# Patient Record
Sex: Male | Born: 1955 | Race: Black or African American | Hispanic: No | Marital: Single | Smoking: Never smoker
Health system: Southern US, Community
[De-identification: ages and names within clinical notes are randomized; demographics above are authoritative.]

## PROBLEM LIST (undated history)

## (undated) DIAGNOSIS — I10 Essential (primary) hypertension: Secondary | ICD-10-CM

## (undated) DIAGNOSIS — E785 Hyperlipidemia, unspecified: Secondary | ICD-10-CM

## (undated) DIAGNOSIS — T7840XA Allergy, unspecified, initial encounter: Secondary | ICD-10-CM

## (undated) DIAGNOSIS — B9562 Methicillin resistant Staphylococcus aureus infection as the cause of diseases classified elsewhere: Secondary | ICD-10-CM

## (undated) DIAGNOSIS — L0390 Cellulitis, unspecified: Secondary | ICD-10-CM

## (undated) DIAGNOSIS — E559 Vitamin D deficiency, unspecified: Secondary | ICD-10-CM

## (undated) HISTORY — DX: Essential (primary) hypertension: I10

## (undated) HISTORY — DX: Allergy, unspecified, initial encounter: T78.40XA

## (undated) HISTORY — DX: Vitamin D deficiency, unspecified: E55.9

## (undated) HISTORY — DX: Hyperlipidemia, unspecified: E78.5

---

## 1998-07-30 ENCOUNTER — Encounter: Payer: Self-pay | Admitting: Emergency Medicine

## 1998-07-30 ENCOUNTER — Emergency Department (HOSPITAL_COMMUNITY): Admission: EM | Admit: 1998-07-30 | Discharge: 1998-07-30 | Payer: Self-pay | Admitting: Emergency Medicine

## 1998-08-01 ENCOUNTER — Encounter: Payer: Self-pay | Admitting: Emergency Medicine

## 1998-08-01 ENCOUNTER — Emergency Department (HOSPITAL_COMMUNITY): Admission: EM | Admit: 1998-08-01 | Discharge: 1998-08-01 | Payer: Self-pay | Admitting: Emergency Medicine

## 2002-07-04 ENCOUNTER — Emergency Department (HOSPITAL_COMMUNITY): Admission: EM | Admit: 2002-07-04 | Discharge: 2002-07-04 | Payer: Self-pay | Admitting: *Deleted

## 2002-07-04 ENCOUNTER — Encounter: Payer: Self-pay | Admitting: *Deleted

## 2004-10-12 ENCOUNTER — Emergency Department (HOSPITAL_COMMUNITY): Admission: EM | Admit: 2004-10-12 | Discharge: 2004-10-12 | Payer: Self-pay | Admitting: Emergency Medicine

## 2005-04-18 ENCOUNTER — Emergency Department (HOSPITAL_COMMUNITY): Admission: EM | Admit: 2005-04-18 | Discharge: 2005-04-18 | Payer: Self-pay | Admitting: Family Medicine

## 2005-05-02 ENCOUNTER — Emergency Department (HOSPITAL_COMMUNITY): Admission: EM | Admit: 2005-05-02 | Discharge: 2005-05-02 | Payer: Self-pay | Admitting: Emergency Medicine

## 2009-11-02 ENCOUNTER — Emergency Department (HOSPITAL_COMMUNITY): Admission: EM | Admit: 2009-11-02 | Discharge: 2009-11-02 | Payer: Self-pay | Admitting: Emergency Medicine

## 2009-11-06 ENCOUNTER — Emergency Department (HOSPITAL_COMMUNITY): Admission: EM | Admit: 2009-11-06 | Discharge: 2009-11-06 | Payer: Self-pay | Admitting: Emergency Medicine

## 2010-12-19 ENCOUNTER — Ambulatory Visit (AMBULATORY_SURGERY_CENTER): Payer: PRIVATE HEALTH INSURANCE | Admitting: *Deleted

## 2010-12-19 VITALS — Ht 64.0 in | Wt 175.0 lb

## 2010-12-19 DIAGNOSIS — Z1211 Encounter for screening for malignant neoplasm of colon: Secondary | ICD-10-CM

## 2010-12-19 MED ORDER — PEG-KCL-NACL-NASULF-NA ASC-C 100 G PO SOLR: ORAL | Status: DC

## 2011-01-01 ENCOUNTER — Encounter: Payer: Self-pay | Admitting: Internal Medicine

## 2011-01-01 DIAGNOSIS — M839 Adult osteomalacia, unspecified: Secondary | ICD-10-CM | POA: Insufficient documentation

## 2011-01-02 ENCOUNTER — Encounter: Payer: Self-pay | Admitting: Internal Medicine

## 2011-01-02 ENCOUNTER — Ambulatory Visit (AMBULATORY_SURGERY_CENTER): Payer: PRIVATE HEALTH INSURANCE | Admitting: Internal Medicine

## 2011-01-02 VITALS — BP 128/75 | HR 101 | Temp 97.6°F | Resp 22 | Ht 64.0 in | Wt 175.0 lb

## 2011-01-02 DIAGNOSIS — Z1211 Encounter for screening for malignant neoplasm of colon: Secondary | ICD-10-CM

## 2011-01-02 DIAGNOSIS — D126 Benign neoplasm of colon, unspecified: Secondary | ICD-10-CM

## 2011-01-02 MED ORDER — SODIUM CHLORIDE 0.9 % IV SOLN: 500.0000 mL | INTRAVENOUS | Status: DC

## 2011-01-02 NOTE — Patient Instructions (Signed)
Polyps, Colon  A polyp is extra tissue that grows inside your body. Colon polyps grow in the large intestine. The large intestine, also called the colon, is part of your digestive system. It is a long, hollow tube at the end of your digestive tract where your body makes and stores stool. Most polyps are not dangerous. They are benign. This means they are not cancerous. But over time, some types of polyps can turn into cancer. Polyps that are smaller than a pea are usually not harmful. But larger polyps could someday become or may already be cancerous. To be safe, doctors remove all polyps and test them.  WHO GETS POLYPS? Anyone can get polyps, but certain people are more likely than others. You may have a greater chance of getting polyps if:  You are over 50.   You have had polyps before.   Someone in your family has had polyps.   Someone in your family has had cancer of the large intestine.   Find out if someone in your family has had polyps. You may also be more likely to get polyps if you:   Eat a lot of fatty foods   Smoke   Drink alcohol   Do not exercise  Eat too much  SYMPTOMS Most small polyps do not cause symptoms. People often do not know they have one until their caregiver finds it during a regular checkup or while testing them for something else. Some people do have symptoms like these:  Bleeding from the anus. You might notice blood on your underwear or on toilet paper after you have had a bowel movement.   Constipation or diarrhea that lasts more than a week.   Blood in the stool. Blood can make stool look black or it can show up as red streaks in the stool.  If you have any of these symptoms, see your caregiver. HOW DOES THE DOCTOR TEST FOR POLYPS? The doctor can use four tests to check for polyps:  Digital rectal exam. The caregiver wears gloves and checks your rectum (the last part of the large intestine) to see if it feels normal. This test would find polyps only  in the rectum. Your caregiver may need to do one of the other tests listed below to find polyps higher up in the intestine.   Barium enema. The caregiver puts a liquid called barium into your rectum before taking x-rays of your large intestine. Barium makes your intestine look white in the pictures. Polyps are dark, so they are easy to see.   Sigmoidoscopy. With this test, the caregiver can see inside your large intestine. A thin flexible tube is placed into your rectum. The device is called a sigmoidoscope, which has a light and a tiny video camera in it. The caregiver uses the sigmoidoscope to look at the last third of your large intestine.   Colonoscopy. This test is like sigmoidoscopy, but the caregiver looks at all of the large intestine. It usually requires sedation. This is the most common method for finding and removing polyps.  TREATMENT  The caregiver will remove the polyp during sigmoidoscopy or colonoscopy. The polyp is then tested for cancer.   If you have had polyps, your caregiver may want you to get tested regularly in the future.  PREVENTION There is not one sure way to prevent polyps. You might be able to lower your risk of getting them if you:  Eat more fruits and vegetables and less fatty food.     Do not smoke.   Avoid alcohol.   Exercise every day.   Lose weight if you are overweight.   Eating more calcium and folate can also lower your risk of getting polyps. Some foods that are rich in calcium are milk, cheese, and broccoli. Some foods that are rich in folate are chickpeas, kidney beans, and spinach.   Aspirin might help prevent polyps. Studies are under way.  Document Released: 05/23/2004 Document Re-Released: 02/14/2010 Exit[employer] Patient Information 2011 [employer]. 

## 2011-01-03 ENCOUNTER — Telehealth: Payer: Self-pay | Admitting: *Deleted

## 2011-01-03 ENCOUNTER — Telehealth: Payer: Self-pay | Admitting: Internal Medicine

## 2011-01-03 ENCOUNTER — Telehealth: Payer: Self-pay

## 2011-01-03 ENCOUNTER — Encounter: Payer: Self-pay | Admitting: *Deleted

## 2011-01-03 NOTE — Telephone Encounter (Signed)
Patient hung up telephone.

## 2011-01-03 NOTE — Telephone Encounter (Signed)
Spoke with patient concerning questions. Answered all questions.

## 2011-01-03 NOTE — Telephone Encounter (Signed)
Return to work note given to pt. 

## 2011-01-10 ENCOUNTER — Encounter: Payer: Self-pay | Admitting: Internal Medicine

## 2011-01-10 NOTE — Progress Notes (Signed)
Quick Note:  3 adenomas (at least) one polyp lost Repeat colonoscopy approx 12/2013 ______

## 2011-10-12 ENCOUNTER — Telehealth: Payer: Self-pay | Admitting: Internal Medicine

## 2011-10-12 NOTE — Telephone Encounter (Signed)
Patient reports that he has intermittent blood on the tissue after a BM.  Happens about once a month.  He is asking what he needs to do.  He did have a colon in April with 5 polyps removed.  He is asked to try sitz bath, try OTC hydrocortisone cream to rectal area, high fiber diet, and increase the amount of fluid in his diet. He is asked to call back if no improvement or worsening of his rectal bleeding or seek care at an Urgent Care or ER if it is not during regular office hours.  The patient verbalized understanding.

## 2011-10-15 NOTE — Telephone Encounter (Signed)
Agree with recommendations.  

## 2013-01-02 ENCOUNTER — Telehealth: Payer: Self-pay | Admitting: Physician Assistant

## 2013-01-02 NOTE — Telephone Encounter (Signed)
Was concerned because Viagra was not working "at all"  He has been unable to get an erection.  Also stated his BP has been running really good and he has not had to use his BP meds.  Pt told really was due for routine office visit to have lab work and check BP here.  Can discuss his other issue at same time.  States just signed up for new insurance and when he receives his card will call and schedule appt.

## 2013-04-27 ENCOUNTER — Telehealth: Payer: Self-pay | Admitting: Physician Assistant

## 2013-04-27 ENCOUNTER — Encounter: Payer: Self-pay | Admitting: Physician Assistant

## 2013-04-27 ENCOUNTER — Ambulatory Visit (INDEPENDENT_AMBULATORY_CARE_PROVIDER_SITE_OTHER): Payer: BC Managed Care – PPO | Admitting: Physician Assistant

## 2013-04-27 VITALS — BP 152/104 | HR 68 | Temp 97.6°F | Resp 18 | Ht 63.5 in | Wt 167.0 lb

## 2013-04-27 DIAGNOSIS — E559 Vitamin D deficiency, unspecified: Secondary | ICD-10-CM | POA: Insufficient documentation

## 2013-04-27 DIAGNOSIS — I10 Essential (primary) hypertension: Secondary | ICD-10-CM | POA: Insufficient documentation

## 2013-04-27 DIAGNOSIS — Z125 Encounter for screening for malignant neoplasm of prostate: Secondary | ICD-10-CM

## 2013-04-27 DIAGNOSIS — E785 Hyperlipidemia, unspecified: Secondary | ICD-10-CM | POA: Insufficient documentation

## 2013-04-27 MED ORDER — BENAZEPRIL-HYDROCHLOROTHIAZIDE 10-12.5 MG PO TABS: 1.0000 | ORAL_TABLET | Freq: Every day | ORAL | Status: DC

## 2013-04-27 NOTE — Progress Notes (Signed)
Patient ID: Robert Novak MRN: [id number], DOB: April 13, 1956, 57 y.o. Date of Encounter: @DATE @  Chief Complaint:  Chief Complaint  Patient presents with  . wife states he has yeast infection  . Medication Refill    BP med  not fasting  has benn out of med    HPI: 57 y.o. year old AA male  presents for routine followup office visit. Actually his last regular office visit was back in April 2012. He says the reason he did not have followup since then with because of insurance changes. He says that back then he was taking his blood pressure and cholesterol medicines as directed with no adverse effects. The medicine simply were stopped because of lack of insurance at one point. He is agreeable to restart these medications if needed. He does have insurance coverage at this time.  He reports that his wife has been having some vaginal irritation. She is notices after having sex and therefore thought that maybe it was coming from him. However he says he has had no type of discharge and no rash or abnormality. No dysuria or pyuria.  He has had no other medical problems arises since his last office visit here.   Past Medical History  Diagnosis Date  . Allergy     pollen  . Hypertension   . Hyperlipidemia   . Vitamin D deficiency      Home Meds: See attached medication section for current medication list. Any medications entered into computer today will not appear on this note's list. The medications listed below were entered prior to today. Current Outpatient Prescriptions on File Prior to Visit  Medication Sig Dispense Refill  . ergocalciferol (VITAMIN D2) 50000 UNITS capsule Take 50,000 Units by mouth once a week.         No current facility-administered medications on file prior to visit.    Allergies: No Known Allergies  History   Social History  . Marital Status: Single    Spouse Name: N/A    Number of Children: N/A  . Years of Education: N/A   Occupational History  . Not  on file.   Social History Main Topics  . Smoking status: Never Smoker   . Smokeless tobacco: Never Used  . Alcohol Use: No  . Drug Use: No  . Sexual Activity: Not on file   Other Topics Concern  . Not on file   Social History Narrative  . No narrative on file    No family history on file.   Review of Systems:  See HPI for pertinent ROS. All other ROS negative.     Physical Exam: Blood pressure 152/104, pulse 68, temperature 97.6 F (36.4 C), temperature source Oral, resp. rate 18, height 5' 3.5" (1.613 m), weight 167 lb (75.751 kg)., Body mass index is 29.12 kg/(m^2). General: WNWD AAM. Appears in no acute distress. Neck: Supple. No thyromegaly. No lymphadenopathy.no carotid bruit Lungs: Clear bilaterally to auscultation without wheezes, rales, or rhonchi. Breathing is unlabored. Heart: RRR with S1 S2. No murmurs, rubs, or gallops. Abdomen: Soft, non-tender, non-distended with normoactive bowel sounds. No hepatomegaly. No rebound/guarding. No obvious abdominal masses. Musculoskeletal:  Strength and tone normal for age. Extremities/Skin: Warm and dry. No edema.  Neuro: Alert and oriented X 3. Moves all extremities spontaneously. Gait is normal. CNII-XII grossly in tact. Psych:  Responds to questions appropriately with a normal affect.     ASSESSMENT AND PLAN: he is not fasting today. However he is agreeable to return 57  y.o. year old male with  1. Hyperlipidemia He is nonfasting today. However he is agreeable to return to the clinic fasting to do lab work. Noted that he has lost 13 pounds since his office visit here in April 2012. It is possible that his lipid profile will be different. Therefore think we should repeat a baseline prior to restarting any statin therapy. He says he has been exercising on a treadmill has been the main change. - COMPLETE METABOLIC PANEL WITH GFR; Future - Lipid panel; Future  2. Hypertension He needs to restart his blood pressure medicine. He  is agreeable to do so. He will go ahead and start this and return to clinic to check labs in one week. - benazepril-hydrochlorthiazide (LOTENSIN HCT) 10-12.5 MG per tablet; Take 1 tablet by mouth daily.  Dispense: 90 tablet; Refill: 3 - COMPLETE METABOLIC PANEL WITH GFR; Future  3. Vitamin D deficiency History of vitamin D deficiency in the past. We'll check this level at the Appearing - Vitamin D 25 hydroxy; Future  4. Screening for prostate cancer Last PSA was 312. We'll repeat this with the upcoming lab. - PSA; Future  #5 screening colonoscopy He did have this performed 4 2012. He had a polyp. He is to repeat this in 3 years. This is with [facility].  Signed, [address] [place], [place], [facility] 04/27/2013 10:09 AM

## 2013-04-28 MED ORDER — HYDROCHLOROTHIAZIDE 12.5 MG PO CAPS: 12.5000 mg | ORAL_CAPSULE | Freq: Every day | ORAL | Status: DC

## 2013-04-28 MED ORDER — BENAZEPRIL HCL 10 MG PO TABS: 10.0000 mg | ORAL_TABLET | Freq: Every day | ORAL | Status: DC

## 2013-04-28 NOTE — Telephone Encounter (Signed)
Combo pill deleted and new meds ordered.  Pt is aware

## 2013-04-28 NOTE — Telephone Encounter (Signed)
At OV I restarted BP med he had taken in past.  I guess Insurance just does not cover Combo Pill. Will separate meds. They should be very cheap.  Benazepril 10mg  one po QD # 90 /3 HCTZ 12.5 mg one po QD # 90. 3

## 2013-12-28 ENCOUNTER — Encounter: Payer: Self-pay | Admitting: Family Medicine

## 2013-12-28 ENCOUNTER — Ambulatory Visit (INDEPENDENT_AMBULATORY_CARE_PROVIDER_SITE_OTHER): Payer: Self-pay | Admitting: Family Medicine

## 2013-12-28 VITALS — BP 134/68 | HR 84 | Temp 98.1°F | Resp 16 | Ht 64.0 in | Wt 166.0 lb

## 2013-12-28 DIAGNOSIS — M7030 Other bursitis of elbow, unspecified elbow: Secondary | ICD-10-CM | POA: Insufficient documentation

## 2013-12-28 DIAGNOSIS — L03113 Cellulitis of right upper limb: Secondary | ICD-10-CM

## 2013-12-28 DIAGNOSIS — M7020 Olecranon bursitis, unspecified elbow: Secondary | ICD-10-CM

## 2013-12-28 DIAGNOSIS — IMO0002 Reserved for concepts with insufficient information to code with codable children: Secondary | ICD-10-CM

## 2013-12-28 MED ORDER — SULFAMETHOXAZOLE-TMP DS 800-160 MG PO TABS: 1.0000 | ORAL_TABLET | Freq: Two times a day (BID) | ORAL | Status: DC

## 2013-12-28 NOTE — Patient Instructions (Signed)
Continue blood pressure pills Take ibuprofen or aleve twice a day Start antibiotics twice a day Note for work Return on Wed for a recheck  If you get fever, or elbow joint pain go to the ER

## 2013-12-28 NOTE — Progress Notes (Signed)
Patient ID: Robert Novak, male   DOB: 12/17/1955, 58 y.o.   MRN: [id number]   Subjective:    Patient ID: Robert Novak, male    DOB: October 07, 1955, 58 y.o.   MRN: [id number]  Patient presents for Cellulitis  Pt here with right elbow swelling and redness. He states on Friday there was a wasp in his car but he does not remember being stung subsequently that evening he began having some redness and swelling of the elbow. He was able to work that evening. Over the weekend he noticed that his arm was still red and swollen but the erythema did not spread. He has some mild soreness over the swelling over his elbow but no pain when he moves his arm or the elbow joint. He has not had any fever but states that he had some chills a couple of days. He denies any chest pain shortness of breath. He does not remember any other injury. He does note that he's had a mole on his elbow for many years which is unchanged.    GEN- denies fatigue, fever, weight loss,weakness, recent illness HEENT- denies eye drainage, change in vision, nasal discharge, CVS- denies chest pain, palpitations RESP- denies SOB, cough, wheeze ABD- denies N/V, change in stools, abd pain MSK- denies joint pain, muscle aches, injury Neuro- denies headache, dizziness, syncope, seizure activity       Objective:    BP 134/68  Pulse 84  Temp(Src) 98.1 F (36.7 C) (Oral)  Resp 16  Ht 5\' 4"  (1.626 m)  Wt 166 lb (75.297 kg)  BMI 28.48 kg/m2 GEN- NAD, alert and oriented x3, afebrile MSK- Right arm- FROM, Elbow, non tender with extension and flexion, swelling of elbow- NT  Skin- erythema extending 2-3 inches around elbow,+ warmth, , no induration, +mole on tip of elbow EXT- swelling of area of erythema  Pulses- Radial 2+        Assessment & Plan:      Problem List Items Addressed This Visit   Cellulitis of arm, right - Primary   Bursitis of elbow      Note: This dictation was prepared with Dragon dictation along with smaller  phrase technology. Any transcriptional errors that result from this process are unintentional.

## 2013-12-28 NOTE — Assessment & Plan Note (Addendum)
Start him on Bactrim double strength one tablet twice a day he will return in 48 hours Does not appear to be causing any pain with manipulation and the joint.

## 2013-12-28 NOTE — Assessment & Plan Note (Signed)
This appears to be more bursitis he has no pain with joint movement or manipulation. I'll have him use ice and ibuprofen, no sign of joint infection

## 2013-12-30 ENCOUNTER — Telehealth: Payer: Self-pay | Admitting: *Deleted

## 2013-12-30 ENCOUNTER — Ambulatory Visit (INDEPENDENT_AMBULATORY_CARE_PROVIDER_SITE_OTHER): Payer: Self-pay | Admitting: Family Medicine

## 2013-12-30 ENCOUNTER — Encounter: Payer: Self-pay | Admitting: Family Medicine

## 2013-12-30 ENCOUNTER — Encounter (HOSPITAL_COMMUNITY): Payer: Self-pay | Admitting: Emergency Medicine

## 2013-12-30 ENCOUNTER — Emergency Department (HOSPITAL_COMMUNITY)
Admission: EM | Admit: 2013-12-30 | Discharge: 2013-12-30 | Disposition: A | Discharge status: Home / Self Care | Payer: PRIVATE HEALTH INSURANCE | Attending: Emergency Medicine | Admitting: Emergency Medicine

## 2013-12-30 VITALS — BP 136/82 | HR 78 | Temp 97.9°F | Resp 16 | Ht 64.0 in | Wt 162.0 lb

## 2013-12-30 DIAGNOSIS — M719 Bursopathy, unspecified: Secondary | ICD-10-CM

## 2013-12-30 DIAGNOSIS — Z792 Long term (current) use of antibiotics: Secondary | ICD-10-CM | POA: Insufficient documentation

## 2013-12-30 DIAGNOSIS — Z79899 Other long term (current) drug therapy: Secondary | ICD-10-CM | POA: Insufficient documentation

## 2013-12-30 DIAGNOSIS — E785 Hyperlipidemia, unspecified: Secondary | ICD-10-CM | POA: Insufficient documentation

## 2013-12-30 DIAGNOSIS — E559 Vitamin D deficiency, unspecified: Secondary | ICD-10-CM | POA: Insufficient documentation

## 2013-12-30 DIAGNOSIS — M7020 Olecranon bursitis, unspecified elbow: Secondary | ICD-10-CM | POA: Insufficient documentation

## 2013-12-30 DIAGNOSIS — IMO0002 Reserved for concepts with insufficient information to code with codable children: Secondary | ICD-10-CM

## 2013-12-30 DIAGNOSIS — M7021 Olecranon bursitis, right elbow: Secondary | ICD-10-CM

## 2013-12-30 DIAGNOSIS — L03113 Cellulitis of right upper limb: Secondary | ICD-10-CM

## 2013-12-30 DIAGNOSIS — I10 Essential (primary) hypertension: Secondary | ICD-10-CM | POA: Insufficient documentation

## 2013-12-30 DIAGNOSIS — M7030 Other bursitis of elbow, unspecified elbow: Secondary | ICD-10-CM

## 2013-12-30 NOTE — Discharge Instructions (Signed)
Olecranon Bursitis Bursitis is swelling and soreness (inflammation) of a fluid-filled sac (bursa) that covers and protects a joint. Olecranon bursitis occurs over the elbow.  CAUSES Bursitis can be caused by injury, overuse of the joint, arthritis, or infection.  SYMPTOMS   Tenderness, swelling, warmth, or redness over the elbow.  Elbow pain with movement. This is greater with bending the elbow.  Squeaking sound when the bursa is rubbed or moved.  Increasing size of the bursa without pain or discomfort.  Fever with increasing pain and swelling if the bursa becomes infected. HOME CARE INSTRUCTIONS   Put ice on the affected area.  Put ice in a plastic bag.  Place a towel between your skin and the bag.  Leave the ice on for 15-20 minutes each hour while awake. Do this for the first 2 days.  When resting, elevate your elbow above the level of your heart. This helps reduce swelling.  Continue to put the joint through a full range of motion 4 times per day. Rest the injured joint at other times. When the pain lessens, begin normal slow movements and usual activities.  Only take over-the-counter or prescription medicines for pain, discomfort, or fever as directed by your caregiver.  Reduce your intake of milk and related dairy products (cheese, yogurt). They may make your condition worse. SEEK IMMEDIATE MEDICAL CARE IF:   Your pain increases even during treatment.  You have a fever.  You have heat and inflammation over the bursa and elbow.  You have a red line that goes up your arm.  You have pain with movement of your elbow. MAKE SURE YOU:   Understand these instructions.  Will watch your condition.  Will get help right away if you are not doing well or get worse. Document Released: 09/26/2006 Document Revised: 11/19/2011 Document Reviewed: 08/12/2007 ExitCare Patient Information 2014 [employer].  

## 2013-12-30 NOTE — Telephone Encounter (Signed)
Patient noted to have come in to office to have MD assess increased edema.   Stat referral to be placed.   MD assessed.

## 2013-12-30 NOTE — Telephone Encounter (Signed)
Patient requested that Marc Morgans, shift supervisor be made aware.   [phone].   Call placed and Doug made aware.

## 2013-12-30 NOTE — ED Notes (Signed)
Bed: WTR6 Expected date:  Expected time:  Means of arrival:  Comments: 

## 2013-12-30 NOTE — ED Provider Notes (Signed)
CSN: [id number]     Arrival date & time 12/30/13  2042 History  This chart was scribed for non-physician practitioner Antonietta Breach, PA-C working with Virgel Manifold, MD by Eston Mould, ED Scribe. This patient was seen in room WTR6/WTR6 and the patient's care was started at 10:25 PM .    Chief Complaint  Patient presents with  . Abscess   The history is provided by the patient. No language interpreter was used.   HPI Comments: Robert Novak is a 58 y.o. male who presents to the Emergency Department complaining of abscess to R elbow. He states the abscess presented 5 days ago. Pt states he had a wasp in his car, swapped the wasp and got out his car. He denies seeing any wound from the wasp. Pt states he had some pain to R elbow 4 days ago. He states he lives in [place] and states he was seen at a provider in the area. He states he had a subjective fever and chills but states his sx have subsided. Pt states he has been taking antibiotics and anti-inflammtories he began taking 3 days ago x 2/day. He denies having X-rays taken of his R elbow. Pt states he is scheduled for an apt to have the abscess drained 12/31/13 at 8:30am. Pt denies numbness, loss of sensation, and weakness.   Past Medical History  Diagnosis Date  . Allergy     pollen  . Hypertension   . Hyperlipidemia   . Vitamin D deficiency    History reviewed. No pertinent past surgical history. No family history on file. History  Substance Use Topics  . Smoking status: Never Smoker   . Smokeless tobacco: Never Used  . Alcohol Use: No    Review of Systems  Skin: Negative for wound.  Neurological: Negative for weakness and numbness.  All other systems reviewed and are negative.  Allergies  Review of patient's allergies indicates no known allergies.  Home Medications   Prior to Admission medications   Medication Sig Start Date End Date Taking? Authorizing Provider  benazepril (LOTENSIN) 10 MG tablet Take 1  tablet (10 mg total) by mouth daily. 04/28/13   Lonie Peak Dixon, PA-C  ergocalciferol (VITAMIN D2) 50000 UNITS capsule Take 50,000 Units by mouth once a week.      Historical Provider, MD  hydrochlorothiazide (MICROZIDE) 12.5 MG capsule Take 1 capsule (12.5 mg total) by mouth daily. 04/28/13   Orlena Sheldon, PA-C  Multiple Vitamin (MULTIVITAMIN) tablet Take 1 tablet by mouth daily.    Historical Provider, MD  sulfamethoxazole-trimethoprim (BACTRIM DS) 800-160 MG per tablet Take 1 tablet by mouth 2 (two) times daily. 12/28/13   Alycia Rossetti, MD   BP 136/90  Pulse 83  Temp(Src) 98.1 F (36.7 C) (Oral)  Resp 18  Ht 5\' 4"  (1.626 m)  Wt 160 lb (72.576 kg)  BMI 27.45 kg/m2  SpO2 97%  Physical Exam  Nursing note and vitals reviewed. Constitutional: He is oriented to person, place, and time. He appears well-developed and well-nourished. No distress.  HENT:  Head: Normocephalic and atraumatic.  Eyes: Conjunctivae and EOM are normal. No scleral icterus.  Neck: Normal range of motion.  Cardiovascular: Normal rate, regular rhythm and intact distal pulses.   Distal radial pulse 2+ in right upper extremity  Pulmonary/Chest: Effort normal. No respiratory distress.  Musculoskeletal: Normal range of motion.       Right elbow: He exhibits swelling. He exhibits normal range of motion and no deformity. Tenderness  found. Olecranon process tenderness noted.  Tenderness and swelling of the olecranon process consistent with olecranon bursitis. Area is mildly erythematous and warm to touch. No active drainage. No red linear streaking. Normal range of motion of the R elbow.  Neurological: He is alert and oriented to person, place, and time.  No gross sensory deficits appreciated. Patient with normal grip strength in his right upper extremity. Patient able to wiggle all fingers.  Skin: Skin is warm and dry. No rash noted. He is not diaphoretic. No erythema. No pallor.  Psychiatric: He has a normal mood and affect.  His behavior is normal.    ED Course  Procedures  DIAGNOSTIC STUDIES: Oxygen Saturation is 97% on RA, normal by my interpretation.    COORDINATION OF CARE: 10:33 PM-Discussed treatment plan which includes consult with attending for further tx. Pt agreed to plan.   10:40 PM-Informed pt of spoken decision with attending.   Labs Review Labs Reviewed - No data to display  Imaging Review No results found.   EKG Interpretation None     MDM   Final diagnoses:  Olecranon bursitis of right elbow    Uncomplicated olecranon bursitis. Patient on Bactrim to cover for infection. He is also taking pain medicine for symptoms. He denies any new trauma or injury to the area. Patient is neurovascularly intact on exam without sensory deficits. Normal range of motion of right elbow. Patient has an appointment in 10 hours to have bursitis evaluated and drained by an orthopedist. See no emergent indication for drainage at this time. Patient advised to followup with his orthopedist. Return precautions provided. Patient agreeable to plan with no unaddressed concerns.  I personally performed the services described in this documentation, which was scribed in my presence. The recorded information has been reviewed and is accurate.   Filed Vitals:   12/30/13 2052  BP: 136/90  Pulse: 83  Temp: 98.1 F (36.7 C)  TempSrc: Oral  Resp: 18  Height: 5\' 4"  (1.626 m)  Weight: 160 lb (72.576 kg)  SpO2: 97%     Antonietta Breach, PA-C 12/30/13 2329

## 2013-12-30 NOTE — Assessment & Plan Note (Signed)
He still has the swelling of the elbow over no pain with range of motion feels like he can go back to work. I'll have him continue the anti-inflammatories we will followup by phone next week UTIs doing if not I will send him to orthopedics

## 2013-12-30 NOTE — Telephone Encounter (Signed)
Appointment scheduled for 12/31/2013 at 8:45am  Patient aware.   Letter printed for work.   Robert Novak, patient boss contacted and message left on VM.

## 2013-12-30 NOTE — Patient Instructions (Signed)
Continue current medications We will follow-up by phone next week If not better orthopedics referral

## 2013-12-30 NOTE — ED Notes (Signed)
Pt c/o abscess to R elbow, pt states he is currently on atbx from sports medicine. Pt states he was to have  I & D on Monday but pain too severe. Swelling noted.

## 2013-12-30 NOTE — Telephone Encounter (Signed)
Received call from patient.   Reported that L elbow was assessed this morning by MD, and discussion help about draining edema.   States that MD was going to wait until next week, but patient has noted that edema to area has increased since this morning.   Requested to have MD advise on getting elbow drained today or tomorrow.   MD please advise.

## 2013-12-30 NOTE — Assessment & Plan Note (Signed)
The extended cellulitis is much improved today just after 48 hours of antibiotics. I will have him complete the remainder. He's not having any systemic symptoms

## 2013-12-30 NOTE — Progress Notes (Signed)
Patient ID: Robert Novak, male   DOB: Oct 21, 1955, 58 y.o.   MRN: [id number]   Subjective:    Patient ID: Robert Novak, male    DOB: 1956/09/06, 58 y.o.   MRN: [id number]  Patient presents for F/U elbow  is here for 48 hour followup on his right arm cellulitis and bursitis of the elbow. He's not had any pain and states that the antibiotics are doing well for him. The redness is gone down significantly he is able to move his arm and joint without any difficulties. He is ready to return to work appear    Review Of Systems:  GEN- denies fatigue, fever, weight loss,weakness, recent illness HEENT- denies eye drainage, change in vision, nasal discharge, CVS- denies chest pain, palpitations RESP- denies SOB, cough, wheeze MSK- denies joint pain, muscle aches, injury Neuro- denies headache, dizziness, syncope, seizure activity       Objective:    BP 136/82  Pulse 78  Temp(Src) 97.9 F (36.6 C) (Oral)  Resp 16  Ht 5\' 4"  (1.626 m)  Wt 162 lb (73.483 kg)  BMI 27.79 kg/m2 GEN- NAD, alert and oriented x3, afebrile Skin- erythema  Improved now within outline from Monday, no induration, no warmth,  EXT- swelling of elbow unchanged, FROM Elbow, wrist RUE Pulses- Radial 2+       Assessment & Plan:      Problem List Items Addressed This Visit   Cellulitis of arm, right - Primary     The extended cellulitis is much improved today just after 48 hours of antibiotics. I will have him complete the remainder. He's not having any systemic symptoms    Bursitis of elbow     He still has the swelling of the elbow over no pain with range of motion feels like he can go back to work. I'll have him continue the anti-inflammatories we will followup by phone next week UTIs doing if not I will send him to orthopedics       Note: This dictation was prepared with Dragon dictation along with smaller phrase technology. Any transcriptional errors that result from this process are unintentional.

## 2013-12-30 NOTE — Telephone Encounter (Signed)
MD made aware and advised to have referral placed for orthopedics to drain bursitis.   Order placed.

## 2013-12-31 ENCOUNTER — Encounter (HOSPITAL_COMMUNITY): Payer: Self-pay | Admitting: Emergency Medicine

## 2013-12-31 ENCOUNTER — Emergency Department (HOSPITAL_COMMUNITY)
Admission: EM | Admit: 2013-12-31 | Discharge: 2013-12-31 | Disposition: A | Discharge status: Home / Self Care | Payer: PRIVATE HEALTH INSURANCE | Attending: Emergency Medicine | Admitting: Emergency Medicine

## 2013-12-31 DIAGNOSIS — Z79899 Other long term (current) drug therapy: Secondary | ICD-10-CM | POA: Insufficient documentation

## 2013-12-31 DIAGNOSIS — M7020 Olecranon bursitis, unspecified elbow: Secondary | ICD-10-CM | POA: Diagnosis present

## 2013-12-31 DIAGNOSIS — E785 Hyperlipidemia, unspecified: Secondary | ICD-10-CM | POA: Insufficient documentation

## 2013-12-31 DIAGNOSIS — E559 Vitamin D deficiency, unspecified: Secondary | ICD-10-CM | POA: Insufficient documentation

## 2013-12-31 DIAGNOSIS — Z792 Long term (current) use of antibiotics: Secondary | ICD-10-CM | POA: Insufficient documentation

## 2013-12-31 DIAGNOSIS — I10 Essential (primary) hypertension: Secondary | ICD-10-CM | POA: Insufficient documentation

## 2013-12-31 LAB — GRAM STAIN: SPECIAL REQUESTS: NORMAL

## 2013-12-31 LAB — SYNOVIAL CELL COUNT + DIFF, W/ CRYSTALS
CRYSTALS FLUID: NONE SEEN
LYMPHOCYTES-SYNOVIAL FLD: 3 % (ref 0–20)
MONOCYTE-MACROPHAGE-SYNOVIAL FLUID: 1 % — AB (ref 50–90)
Neutrophil, Synovial: 96 % — ABNORMAL HIGH (ref 0–25)
WBC, SYNOVIAL: 20478 /mm3 — AB (ref 0–200)

## 2013-12-31 NOTE — ED Notes (Signed)
Gram stain results called from lab: WBC present, TMN and mononuclear cells seen. No organisms were seen.

## 2013-12-31 NOTE — Discharge Instructions (Signed)
Olecranon Bursitis   A bursa is a fluid-filled sac that covers and protects a joint. Bursitis is when the fluid-filled sac gets puffy and sore (inflammed). Olecranon bursitis occurs over the elbow. It is often caused by falling on the elbow, a joint disorder (arthritis), or infection. It can also be caused by overuse of the elbow joint.  HOME CARE  · Put ice on the affected area.  · Put ice in a plastic bag.  · Place a towel between your skin and the bag.  · Leave the ice on for 15-20 minutes each hour while awake. Do this for the first 2 days.  · Raise (elevate) your elbow above your heart when resting.  · Bend, straighten, and move your joint 4 times a day. Rest the injured joint at other times. When you have less pain, begin slow movements and usual activities.  · Only take medicine as told by your doctor.  · Limit the amount of dairy you eat and drink (milk, cheese, yogurt).  GET HELP RIGHT AWAY IF:   · You have more pain even with treatment.  · You have a fever.  · You have warmth and irritation over the fluid-filled sac and elbow.  · You have a red line going up your arm.  · You have pain when you move your elbow.  MAKE SURE YOU:   · Understand these instructions.  · Will watch your condition.  · Will get help right away if you are not doing well or get worse.  Document Released: 02/14/2010 Document Revised: 11/19/2011 Document Reviewed: 02/14/2010  [employer]® Patient Information ©2014 [employer].

## 2013-12-31 NOTE — ED Notes (Signed)
MD at bedside. 

## 2013-12-31 NOTE — ED Notes (Signed)
Pt c/o continued right elbow pain/swelling; states referred to orthopedic doctor but didn't have $250 payment

## 2013-12-31 NOTE — Progress Notes (Signed)
[facility] provided pt with a list of primary care resources. Patient stated pending insurance through job.

## 2013-12-31 NOTE — ED Provider Notes (Signed)
CSN: [id number]     Arrival date & time 12/31/13  0349 History   First MD Initiated Contact with Patient 12/31/13 0901     Chief Complaint  Patient presents with  . Elbow Pain     (Consider location/radiation/quality/duration/timing/severity/associated sxs/prior Treatment) Patient is a 58 y.o. male presenting with arm injury. The history is provided by the patient.  Arm Injury Location:  Elbow Time since incident:  5 days Injury: no   Elbow location:  R elbow Pain details:    Quality:  Aching   Radiates to:  Does not radiate   Severity:  Mild   Onset quality:  Gradual   Duration:  5 days   Timing:  Constant   Progression:  Worsening Chronicity:  New Dislocation: no   Foreign body present:  No foreign bodies Prior injury to area:  No Relieved by: advil. Worsened by:  Nothing tried Ineffective treatments:  None tried Associated symptoms: no fever and no neck pain     Past Medical History  Diagnosis Date  . Allergy     pollen  . Hypertension   . Hyperlipidemia   . Vitamin D deficiency    History reviewed. No pertinent past surgical history. No family history on file. History  Substance Use Topics  . Smoking status: Never Smoker   . Smokeless tobacco: Never Used  . Alcohol Use: No    Review of Systems  Constitutional: Negative for fever.  HENT: Negative for drooling and rhinorrhea.   Eyes: Negative for pain.  Respiratory: Negative for cough and shortness of breath.   Cardiovascular: Negative for chest pain and leg swelling.  Gastrointestinal: Negative for nausea, vomiting, abdominal pain and diarrhea.  Genitourinary: Negative for dysuria and hematuria.  Musculoskeletal: Negative for gait problem and neck pain.  Skin: Negative for color change.  Neurological: Negative for numbness and headaches.  Hematological: Negative for adenopathy.  Psychiatric/Behavioral: Negative for behavioral problems.  All other systems reviewed and are negative.     Allergies   Review of patient's allergies indicates no known allergies.  Home Medications   Prior to Admission medications   Medication Sig Start Date End Date Taking? Authorizing Provider  benazepril (LOTENSIN) 10 MG tablet Take 1 tablet (10 mg total) by mouth daily. 04/28/13   Lonie Peak Dixon, PA-C  ergocalciferol (VITAMIN D2) 50000 UNITS capsule Take 50,000 Units by mouth once a week.      Historical Provider, MD  hydrochlorothiazide (MICROZIDE) 12.5 MG capsule Take 1 capsule (12.5 mg total) by mouth daily. 04/28/13   Orlena Sheldon, PA-C  Multiple Vitamin (MULTIVITAMIN) tablet Take 1 tablet by mouth daily.    Historical Provider, MD  sulfamethoxazole-trimethoprim (BACTRIM DS) 800-160 MG per tablet Take 1 tablet by mouth 2 (two) times daily. 12/28/13   Alycia Rossetti, MD   BP 147/96  Pulse 69  Temp(Src) 98.1 F (36.7 C)  Resp 20  SpO2 100% Physical Exam  Nursing note and vitals reviewed. Constitutional: He is oriented to person, place, and time. He appears well-developed and well-nourished.  HENT:  Head: Normocephalic and atraumatic.  Right Ear: External ear normal.  Left Ear: External ear normal.  Nose: Nose normal.  Mouth/Throat: Oropharynx is clear and moist. No oropharyngeal exudate.  Eyes: Conjunctivae and EOM are normal. Pupils are equal, round, and reactive to light.  Neck: Normal range of motion. Neck supple.  Cardiovascular: Normal rate, regular rhythm, normal heart sounds and intact distal pulses.  Exam reveals no gallop and no friction rub.  No murmur heard. Pulmonary/Chest: Effort normal and breath sounds normal. No respiratory distress. He has no wheezes.  Abdominal: Soft. Bowel sounds are normal. He exhibits no distension. There is no tenderness. There is no rebound and no guarding.  Musculoskeletal: Normal range of motion. He exhibits no edema and no tenderness.  Mild to moderate effusion of the right olecranon bursa. Mild warmth. Very faint surrounding erythema. A small nevus is  is located on the elbow.   Normal rom of the right elbow w/out pain.   Neurological: He is alert and oriented to person, place, and time.  Skin: Skin is warm and dry.  Psychiatric: He has a normal mood and affect. His behavior is normal.    ED Course  Aspiration of bursa Date/Time: 12/31/2013 7:58 PM Performed by: Pamella Pert, S Authorized by: Pamella Pert, S Consent: Verbal consent obtained. written consent obtained. Risks and benefits: risks, benefits and alternatives were discussed Consent given by: patient Patient understanding: patient states understanding of the procedure being performed Patient consent: the patient's understanding of the procedure matches consent given Procedure consent: procedure consent matches procedure scheduled Relevant documents: relevant documents present and verified Test results: test results available and properly labeled Site marked: the operative site was marked Required items: required blood products, implants, devices, and special equipment available Patient identity confirmed: verbally with patient, arm band, provided demographic data and hospital-assigned identification number Time out: Immediately prior to procedure a "time out" was called to verify the correct patient, procedure, equipment, support staff and site/side marked as required. Indications: joint swelling,  pain and diagnostic evaluation  Location: right olecranon bursa. Local anesthesia used: no Patient sedated: no Preparation: Patient was prepped and draped in the usual sterile fashion. Needle gauge: 18 G Ultrasound guidance: no Approach: superior Aspirate characteristics: amber. Aspirate amount: 10 ml   (including critical care time) Labs Review Labs Reviewed  SYNOVIAL CELL COUNT + DIFF, W/ CRYSTALS - Abnormal; Notable for the following:    Color, Synovial AMBER (*)    Appearance-Synovial TURBID (*)    WBC, Synovial 20478 (*)    Neutrophil, Synovial 96 (*)     Monocyte-Macrophage-Synovial Fluid 1 (*)    All other components within normal limits  BODY FLUID CULTURE  GRAM STAIN    Imaging Review No results found.   EKG Interpretation None      MDM   Final diagnoses:  Olecranon bursitis    9:21 AM 58 y.o. male who presents with right olecranon bursitis. The patient was seen by his primary care Dr. 3 days ago and started on Bactrim. He was seen here in ER yesterday and had planned to be seen by sports medicine today. He was unable to be seen by sports medicine as he cannot afford the co-pay. He presents now for aspiration of the bursitis. He states that when he first noticed the elbow swelling he was having some subjective fever and chills but has not had them since that time. He notes that the swelling has increased slightly but does not have any increased pain, fever, vomiting, or other associated symptoms. He is afebrile and vital signs are unremarkable here.  10:56 AM: Do not think this is a septic joint, will call pt w/ results. Pt gave me authorization to notify wife of the findings. I have discussed the diagnosis/risks/treatment options with the patient and believe the pt to be eligible for discharge home to follow-up with pcp as needed. We also discussed returning to the ED immediately if new  or worsening sx occur. We discussed the sx which are most concerning (e.g., worsening pain, redness, swelling, fever) that necessitate immediate return. Medications administered to the patient during their visit and any new prescriptions provided to the patient are listed below.  8:06 PM Notified wife of synovial fluid findings. Will cont to recommend sx therapy, return precautions again re-enforced.   Medications given during this visit Medications - No data to display  Discharge Medication List as of 12/31/2013 10:57 AM       Blanchard Kelch, MD 12/31/13 2008

## 2014-01-01 ENCOUNTER — Telehealth (HOSPITAL_BASED_OUTPATIENT_CLINIC_OR_DEPARTMENT_OTHER): Payer: Self-pay

## 2014-01-01 NOTE — ED Provider Notes (Signed)
Medical screening examination/treatment/procedure(s) were performed by non-physician practitioner and as supervising physician I was immediately available for consultation/collaboration.   EKG Interpretation None       Virgel Manifold, MD 01/01/14 [phone]

## 2014-01-02 ENCOUNTER — Telehealth (HOSPITAL_BASED_OUTPATIENT_CLINIC_OR_DEPARTMENT_OTHER): Payer: Self-pay

## 2014-01-02 LAB — BODY FLUID CULTURE: Special Requests: NORMAL

## 2014-01-02 NOTE — Telephone Encounter (Signed)
Chart reviewed by A. Harris PA "Bactrim DS # 6 1 BID x 3 days, no refills" Pt informed of dx, need for addl tx and provided MRSA precautions.  Rx called and left on VM at [facility]

## 2014-01-02 NOTE — Telephone Encounter (Signed)
Call from [facility] (+) Synovial Fluid Rt Elbow - MRSA.  Pt given Rx by PCP 4/20 for Bactrim DS -> Sensitive to the same.  Will have MD review chart.

## 2014-01-11 ENCOUNTER — Encounter: Payer: Self-pay | Admitting: Internal Medicine

## 2014-01-15 ENCOUNTER — Telehealth: Payer: Self-pay | Admitting: Physician Assistant

## 2014-01-15 NOTE — Telephone Encounter (Addendum)
Patient is requesting refill on viagra in generic if possible [phone]

## 2014-01-15 NOTE — Telephone Encounter (Signed)
Please advise 

## 2014-01-18 NOTE — Telephone Encounter (Signed)
He has to schedule an office visit. 1- his last visit here was 04/2013. That  Note mentions nothing to do with ED or ED medications. 2- he is on blood pressure medications that require lab monitoring. 3-At  his visit 04/2013 future fasting labs were ordered but he has never come back in to do cholesterol etc. Tell him to schedule an office visit for early morning and come fasting to that appointment.!!!!!!

## 2014-01-18 NOTE — Telephone Encounter (Signed)
Pt called back.  Told him he NTBS.  He states was just seen last month.  That was for sick visit.  Needs to be seen for routine visit and fasting lab work before provider can order this medication.  Reminded him last routine visit was last August and he was to return for fasting labs and he did not.  Must be seen before we can order Viagra.  He says he will call back for any appt.

## 2014-01-18 NOTE — Telephone Encounter (Signed)
[facility]

## 2014-02-26 ENCOUNTER — Encounter (HOSPITAL_COMMUNITY): Payer: Self-pay | Admitting: Emergency Medicine

## 2014-02-26 ENCOUNTER — Emergency Department (INDEPENDENT_AMBULATORY_CARE_PROVIDER_SITE_OTHER)
Admission: EM | Admit: 2014-02-26 | Discharge: 2014-02-26 | Disposition: A | Discharge status: Home / Self Care | Payer: PRIVATE HEALTH INSURANCE | Source: Home / Self Care

## 2014-02-26 DIAGNOSIS — M7020 Olecranon bursitis, unspecified elbow: Secondary | ICD-10-CM

## 2014-02-26 DIAGNOSIS — M71021 Abscess of bursa, right elbow: Secondary | ICD-10-CM

## 2014-02-26 DIAGNOSIS — M6789 Other specified disorders of synovium and tendon, multiple sites: Secondary | ICD-10-CM

## 2014-02-26 DIAGNOSIS — M7021 Olecranon bursitis, right elbow: Secondary | ICD-10-CM

## 2014-02-26 HISTORY — DX: Cellulitis, unspecified: L03.90

## 2014-02-26 HISTORY — DX: Methicillin resistant Staphylococcus aureus infection as the cause of diseases classified elsewhere: B95.62

## 2014-02-26 MED ORDER — CLINDAMYCIN HCL 300 MG PO CAPS: 300.0000 mg | ORAL_CAPSULE | Freq: Three times a day (TID) | ORAL | Status: DC

## 2014-02-26 NOTE — ED Provider Notes (Signed)
Medical screening examination/treatment/procedure(s) were performed by resident physician or non-physician practitioner and as supervising physician I was immediately available for consultation/collaboration.   Pauline Good MD.   Billy Fischer, MD 02/26/14 [phone]

## 2014-02-26 NOTE — ED Notes (Signed)
Call back number for lab issues verified at release  

## 2014-02-26 NOTE — Discharge Instructions (Signed)
Abscess An abscess is an infected area that contains a collection of pus and debris.It can occur in almost any part of the body. An abscess is also known as a furuncle or boil. CAUSES  An abscess occurs when tissue gets infected. This can occur from blockage of oil or sweat glands, infection of hair follicles, or a minor injury to the skin. As the body tries to fight the infection, pus collects in the area and creates pressure under the skin. This pressure causes pain. People with weakened immune systems have difficulty fighting infections and get certain abscesses more often.  SYMPTOMS Usually an abscess develops on the skin and becomes a painful mass that is red, warm, and tender. If the abscess forms under the skin, you may feel a moveable soft area under the skin. Some abscesses break open (rupture) on their own, but most will continue to get worse without care. The infection can spread deeper into the body and eventually into the bloodstream, causing you to feel ill.  DIAGNOSIS  Your caregiver will take your medical history and perform a physical exam. A sample of fluid may also be taken from the abscess to determine what is causing your infection. TREATMENT  Your caregiver may prescribe antibiotic medicines to fight the infection. However, taking antibiotics alone usually does not cure an abscess. Your caregiver may need to make a small cut (incision) in the abscess to drain the pus. In some cases, gauze is packed into the abscess to reduce pain and to continue draining the area. HOME CARE INSTRUCTIONS   Only take over-the-counter or prescription medicines for pain, discomfort, or fever as directed by your caregiver.  If you were prescribed antibiotics, take them as directed. Finish them even if you start to feel better.  If gauze is used, follow your caregiver's directions for changing the gauze.  To avoid spreading the infection:  Keep your draining abscess covered with a  bandage.  Wash your hands well.  Do not share personal care items, towels, or whirlpools with others.  Avoid skin contact with others.  Keep your skin and clothes clean around the abscess.  Keep all follow-up appointments as directed by your caregiver. SEEK MEDICAL CARE IF:   You have increased pain, swelling, redness, fluid drainage, or bleeding.  You have muscle aches, chills, or a general ill feeling.  You have a fever. MAKE SURE YOU:   Understand these instructions.  Will watch your condition.  Will get help right away if you are not doing well or get worse. Document Released: 06/06/2005 Document Revised: 02/26/2012 Document Reviewed: 11/09/2011 [facility] Patient Information 2015 , [place]. This information is not intended to replace advice given to you by your health care provider. Make sure you discuss any questions you have with your health care provider.  Bursitis Bursitis is when the fluid-filled sac (bursa) that covers and protects a joint gets puffy and irritated. The elbow, shoulder, hip, and knee joints are most often affected. HOME CARE  Put ice on the area.  Put ice in a plastic bag.  Place a towel between your skin and the bag.  Leave the ice on for 15-20 minutes, 03-04 times a day.  Put the joint through a full range of motion 4 times a day. Rest the injured joint at other times. When you have less pain, begin slow movements and usual activities.  Only take medicine as told by your doctor.  Follow up with your doctor. Any delay in care could stop  the bursitis from healing. This could cause long-term pain. GET HELP RIGHT AWAY IF:   You have more pain with treatment.  You have a temperature by mouth above 102 F (38.9 C), not controlled by medicine.  You have heat and irritation over the fluid-filled sac. MAKE SURE YOU:   Understand these instructions.  Will watch your condition.  Will get help right away if you are not doing well or get  worse. Document Released: 02/14/2010 Document Revised: 11/19/2011 Document Reviewed: 02/14/2010 [facility] Patient Information 2015 [place], [place]. This information is not intended to replace advice given to you by your health care provider. Make sure you discuss any questions you have with your health care provider.  Olecranon Bursitis Bursitis is swelling and soreness (inflammation) of a fluid-filled sac (bursa) that covers and protects a joint. Olecranon bursitis occurs over the elbow.  CAUSES Bursitis can be caused by injury, overuse of the joint, arthritis, or infection.  SYMPTOMS   Tenderness, swelling, warmth, or redness over the elbow.  Elbow pain with movement. This is greater with bending the elbow.  Squeaking sound when the bursa is rubbed or moved.  Increasing size of the bursa without pain or discomfort.  Fever with increasing pain and swelling if the bursa becomes infected. HOME CARE INSTRUCTIONS   Put ice on the affected area.  Put ice in a plastic bag.  Place a towel between your skin and the bag.  Leave the ice on for 15-20 minutes each hour while awake. Do this for the first 2 days.  When resting, elevate your elbow above the level of your heart. This helps reduce swelling.  Continue to put the joint through a full range of motion 4 times per day. Rest the injured joint at other times. When the pain lessens, begin normal slow movements and usual activities.  Only take over-the-counter or prescription medicines for pain, discomfort, or fever as directed by your caregiver.  Reduce your intake of milk and related dairy products (cheese, yogurt). They may make your condition worse. SEEK IMMEDIATE MEDICAL CARE IF:   Your pain increases even during treatment.  You have a fever.  You have heat and inflammation over the bursa and elbow.  You have a red line that goes up your arm.  You have pain with movement of your elbow. MAKE SURE YOU:   Understand these  instructions.  Will watch your condition.  Will get help right away if you are not doing well or get worse. Document Released: 09/26/2006 Document Revised: 11/19/2011 Document Reviewed: 08/12/2007 [facility] Patient Information 2015 [place], [place]. This information is not intended to replace advice given to you by your health care provider. Make sure you discuss any questions you have with your health care provider.

## 2014-02-26 NOTE — ED Provider Notes (Signed)
CSN: [id number]     Arrival date & time 02/26/14  1245 History   First MD Initiated Contact with Patient 02/26/14 1348     Chief Complaint  Patient presents with  . Joint Swelling   (Consider location/radiation/quality/duration/timing/severity/associated sxs/prior Treatment) HPI Comments: Swelling, mild redness and tension over the right olecranon bursa. 12/31/13 Had olecranon bursitis contaminated with MRSA ts with Septra with resolution. This week with smaller area of swelling over olecranon.    Past Medical History  Diagnosis Date  . Allergy     pollen  . Hypertension   . Hyperlipidemia   . Vitamin D deficiency    History reviewed. No pertinent past surgical history. History reviewed. No pertinent family history. History  Substance Use Topics  . Smoking status: Never Smoker   . Smokeless tobacco: Never Used  . Alcohol Use: No    Review of Systems  Constitutional: Negative.   Skin:       As per hpi    Allergies  Review of patient's allergies indicates no known allergies.  Home Medications   Prior to Admission medications   Medication Sig Start Date End Date Taking? Authorizing Provider  benazepril (LOTENSIN) 10 MG tablet Take 1 tablet (10 mg total) by mouth daily. 04/28/13   Lonie Peak Dixon, PA-C  clindamycin (CLEOCIN) 300 MG capsule Take 1 capsule (300 mg total) by mouth 3 (three) times daily. 02/26/14   Janne Napoleon, NP  hydrochlorothiazide (MICROZIDE) 12.5 MG capsule Take 1 capsule (12.5 mg total) by mouth daily. 04/28/13   Orlena Sheldon, PA-C  Multiple Vitamin (MULTIVITAMIN) tablet Take 1 tablet by mouth daily.    Historical Provider, MD  sulfamethoxazole-trimethoprim (BACTRIM DS) 800-160 MG per tablet Take 1 tablet by mouth 2 (two) times daily. 12/28/13   Alycia Rossetti, MD   BP 153/109  Pulse 63  Temp(Src) 97.4 F (36.3 C) (Oral)  Resp 18  SpO2 99% Physical Exam  Nursing note and vitals reviewed. Constitutional: He appears well-developed and well-nourished.   Neck: Neck supple.  Cardiovascular: Normal rate.   Pulmonary/Chest: Effort normal. No respiratory distress.  Musculoskeletal: Normal range of motion. He exhibits no edema.  Neurological: He is alert. He exhibits normal muscle tone.  Skin: Skin is warm and dry.  R. Elbow with puffiness and mild redness over the olecranon. Swelling extends 3-4 cm around the olecranon with minor redness. No bony tenderness. Full ROM.   Psychiatric: He has a normal mood and affect.    ED Course  INCISION AND DRAINAGE Date/Time: 02/26/2014 2:15 PM Performed by: Marcha Dutton, DAVID Authorized by: Ihor Gully D Consent: Verbal consent obtained. Risks and benefits: risks, benefits and alternatives were discussed Consent given by: patient Patient understanding: patient states understanding of the procedure being performed Patient identity confirmed: verbally with patient Type: abscess Body area: upper extremity Location details: right elbow Local anesthetic: topical anesthetic Patient sedated: no Drainage: purulent Drainage amount: scant Comments: Sterile puncture wound with 18 g needle. Appox 1 cc of purulent, thick fluid obtained and cultured. Dressing applied. No typical bursa fluid obtained.   (including critical care time) Labs Review Labs Reviewed  CULTURE, ROUTINE-ABSCESS    Imaging Review No results found.   MDM   1. Olecranon bursitis, right   2. Abscess of bursa, right elbow    Very possible this infection is outside the bursa . Not the joint I and D Clindamycin 300 Rechk 3 days. Warm compresses.     Janne Napoleon, NP 02/26/14 1435

## 2014-02-26 NOTE — ED Notes (Signed)
C/o pain and swelling in right elbow. Had same area drained in ED a while back, and it is coming back again. They told me it was okay

## 2014-03-01 LAB — CULTURE, ROUTINE-ABSCESS

## 2014-03-01 NOTE — ED Notes (Signed)
Abscess culture R olecranon bursa: Mod. Staph. Aureus.  Pt. adequately treated with I and D and Cleocin. Roselyn Meier 03/01/2014

## 2014-04-01 ENCOUNTER — Telehealth: Payer: Self-pay | Admitting: Physician Assistant

## 2014-04-01 NOTE — Telephone Encounter (Signed)
Patient would only talk to you only about some medical issues he is having please call him back at [phone]

## 2014-04-01 NOTE — Telephone Encounter (Signed)
Calling states wife has terrible vaginal itching only after they have intercourse.  He wants to know if there is something wrong with him or what.  I told patient hard to tell but that both he and his wife should be checked.

## 2014-04-09 ENCOUNTER — Ambulatory Visit (INDEPENDENT_AMBULATORY_CARE_PROVIDER_SITE_OTHER): Payer: PRIVATE HEALTH INSURANCE | Admitting: Family Medicine

## 2014-04-09 ENCOUNTER — Encounter: Payer: Self-pay | Admitting: Family Medicine

## 2014-04-09 VITALS — BP 136/78 | HR 62 | Temp 97.6°F | Resp 14 | Ht 64.0 in | Wt 166.0 lb

## 2014-04-09 DIAGNOSIS — J069 Acute upper respiratory infection, unspecified: Secondary | ICD-10-CM

## 2014-04-09 DIAGNOSIS — N529 Male erectile dysfunction, unspecified: Secondary | ICD-10-CM

## 2014-04-09 DIAGNOSIS — Z113 Encounter for screening for infections with a predominantly sexual mode of transmission: Secondary | ICD-10-CM

## 2014-04-09 LAB — WET PREP FOR TRICH, YEAST, CLUE
Clue Cells Wet Prep HPF POC: NONE SEEN
Trich, Wet Prep: NONE SEEN
WBC WET PREP: NONE SEEN
YEAST WET PREP: NONE SEEN

## 2014-04-09 MED ORDER — SILDENAFIL CITRATE 100 MG PO TABS: 100.0000 mg | ORAL_TABLET | Freq: Every day | ORAL | Status: DC | PRN

## 2014-04-09 MED ORDER — GUAIFENESIN-CODEINE 100-10 MG/5ML PO SOLN: 10.0000 mL | Freq: Four times a day (QID) | ORAL | Status: DC | PRN

## 2014-04-09 NOTE — Assessment & Plan Note (Signed)
Will obtain screenings gonorrhea chlamydia done to the urine. I did take a sample of the discharge noted around the penis and I think this is more due to hygiene he does not have any red plaques are itching in the regions. The wet prep did not show any yeast

## 2014-04-09 NOTE — Progress Notes (Signed)
Patient ID: Robert Novak, male   DOB: 01-25-1956, 58 y.o.   MRN: [id number]   Subjective:    Patient ID: Robert Novak, male    DOB: 1955-11-15, 58 y.o.   MRN: [id number]  Patient presents for Cough and Erectile Dysfunction   Patient here with a few concerns. He's had a cough for the past 3 days with minimal production no fever no sore throat no chest pain. He was using an herbal a home that has a mixture of vinegar and honey with minimal improvement in his cough.  Difficulties with his erection he is able to get an erection however he has difficulty sustaining an at times. He also has had episodes of premature ejaculation. On the same note he states that his wife has been complaining of vaginal irritation for the past couple weeks from ear intermittent. She wanted to be checked. They have a monogamous relationship. He denies any penile discharge any redness or pain. He's not sure she's been checked vaginally. He recently started wearing condoms see if that would help as well as lubrication.    Review Of Systems:  GEN- denies fatigue, fever, weight loss,weakness, recent illness HEENT- denies eye drainage, change in vision, nasal discharge, CVS- denies chest pain, palpitations RESP- denies SOB, +cough, wheeze ABD- denies N/V, change in stools, abd pain GU- denies dysuria, hematuria, dribbling, incontinence Neuro- denies headache, dizziness, syncope, seizure activity       Objective:    BP 136/78  Pulse 62  Temp(Src) 97.6 F (36.4 C) (Oral)  Resp 14  Ht 5\' 4"  (1.626 m)  Wt 166 lb (75.297 kg)  BMI 28.48 kg/m2 GEN- NAD, alert and oriented x3 HEENT- PERRL, EOMI, non injected sclera, pink conjunctiva, MMM, oropharynx clear, no maxillary sinus tenderness, nares clear, TM clear bilat  Neck- Supple, no LAD CVS- RRR, no murmur RESP-CTAB GU- normal external appearance, uncircumcised, white discharge at risk of foreskin and head of penis, no erythema, no odor, no discharge from  urethra EXT- No edema        Assessment & Plan:      Problem List Items Addressed This Visit   Screen for STD (sexually transmitted disease) - Primary     Will obtain screenings gonorrhea chlamydia done to the urine. I did take a sample of the discharge noted around the penis and I think this is more due to hygiene he does not have any red plaques are itching in the regions. The wet prep did not show any yeast    Relevant Orders      WET PREP FOR [place], YEAST, CLUE (Completed)      GC/chlamydia probe amp, urine   Erectile dysfunction     Given prescription for Viagra 100 mg that he can try. He states that he may hold off at this time    Acute URI     I think he may have a mild virus there no red flags on exam. I've given him some Robitussin with codeine to use  Supportive care with fluid       Note: This dictation was prepared with Dragon dictation along with smaller phrase technology. Any transcriptional errors that result from this process are unintentional.

## 2014-04-09 NOTE — Assessment & Plan Note (Signed)
I think he may have a mild virus there no red flags on exam. I've given him some Robitussin with codeine to use  Supportive care with fluid

## 2014-04-09 NOTE — Patient Instructions (Signed)
Take cough medicine as prescribed We will call with results F/U as needed

## 2014-04-09 NOTE — Assessment & Plan Note (Signed)
Given prescription for Viagra 100 mg that he can try. He states that he may hold off at this time

## 2014-04-10 LAB — GC/CHLAMYDIA PROBE AMP, URINE
CHLAMYDIA, SWAB/URINE, PCR: NEGATIVE
GC PROBE AMP, URINE: NEGATIVE

## 2014-10-05 ENCOUNTER — Encounter: Payer: Self-pay | Admitting: Physician Assistant

## 2014-11-12 ENCOUNTER — Encounter: Payer: Self-pay | Admitting: Internal Medicine

## 2015-11-02 ENCOUNTER — Ambulatory Visit (INDEPENDENT_AMBULATORY_CARE_PROVIDER_SITE_OTHER): Payer: Self-pay | Admitting: Emergency Medicine

## 2015-11-02 VITALS — BP 120/78 | HR 76 | Temp 97.7°F | Resp 17 | Ht 65.5 in | Wt 171.0 lb

## 2015-11-02 DIAGNOSIS — Z0000 Encounter for general adult medical examination without abnormal findings: Secondary | ICD-10-CM

## 2015-11-02 DIAGNOSIS — Z021 Encounter for pre-employment examination: Secondary | ICD-10-CM

## 2015-11-02 DIAGNOSIS — N528 Other male erectile dysfunction: Secondary | ICD-10-CM

## 2015-11-02 MED ORDER — SILDENAFIL CITRATE 20 MG PO TABS: ORAL_TABLET | ORAL | Status: DC

## 2015-11-02 NOTE — Progress Notes (Signed)
By signing my name below, I, Robert Novak, attest that this documentation has been prepared under the direction and in the presence of Robert Queen, MD. Electronically Signed: Moises Novak, Robert Novak. 11/02/2015 , 10:31 AM .  Patient was seen in room 2 .  Chief Complaint:  Chief Complaint  Patient presents with  . Employment Physical    DOT     HPI: Robert Novak is a 60 y.o. male who reports to [facility] today for DOT physical. He feels generally well. He denies chronic medical illnesses. He denies taking chronic medications.   He wants refill for viagra.   He mentions having children, oldest at 73 years old, and youngest at 57 years old. He also has grandchildren.   Past Medical History  Diagnosis Date  . Allergy     pollen  . Hypertension   . Hyperlipidemia   . Vitamin D deficiency   . MRSA cellulitis    No past surgical history on file. Social History   Social History  . Marital Status: Single    Spouse Name: N/A  . Number of Children: N/A  . Years of Education: N/A   Social History Main Topics  . Smoking status: Never Smoker   . Smokeless tobacco: Never Used  . Alcohol Use: No  . Drug Use: No  . Sexual Activity: Not on file   Other Topics Concern  . Not on file   Social History Narrative   Got Married in 2012.    Robert Novak already had 2 children.    Pt already had 2 children.    [name], they had an unexpected baby in 2012.       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   No family history on file. No Known Allergies Prior to Admission medications   Medication Sig Start Date End Date Taking? Authorizing Provider  GARLIC PO Take by mouth.   Yes Historical Provider, MD  Ginger, Zingiber officinalis, (GINGER PO) Take by mouth.   Yes Historical Provider, MD  guaiFENesin-codeine 100-10 MG/5ML syrup Take 10 mLs by mouth every 6 (six) hours as needed for cough. 04/09/14  Yes Robert Rossetti, MD  hydrochlorothiazide (MICROZIDE) 12.5 MG capsule Take 1 capsule  (12.5 mg total) by mouth daily. Patient not taking: Reported on 11/02/2015 04/28/13   Robert Sheldon, PA-C  sildenafil (VIAGRA) 100 MG tablet Take 1 tablet (100 mg total) by mouth daily as needed for erectile dysfunction. Patient not taking: Reported on 11/02/2015 04/09/14   Robert Rossetti, MD     ROS:  Constitutional: negative for fever, chills, night sweats, weight changes, or fatigue  HEENT: negative for vision changes, hearing loss, congestion, rhinorrhea, ST, epistaxis, or sinus pressure Cardiovascular: negative for chest pain or palpitations Respiratory: negative for hemoptysis, wheezing, shortness of breath, or cough Abdominal: negative for abdominal pain, nausea, vomiting, diarrhea, or constipation Dermatological: negative for rash Neurologic: negative for headache, dizziness, or syncope All other systems reviewed and are otherwise negative with the exception to those above and in the HPI.  PHYSICAL EXAM: Filed Vitals:   11/02/15 0953  BP: 120/78  Pulse: 76  Temp: 97.7 F (36.5 C)  Resp: 17   Body mass index is 28.01 kg/(m^2).   General: Alert, no acute distress HEENT:  Normocephalic, atraumatic, oropharynx patent. Eye: Robert Novak Cardiovascular:  Regular rate and rhythm, no rubs murmurs or gallops.  No Carotid bruits, radial pulse intact. No pedal edema.  Respiratory: Clear to auscultation bilaterally.  No wheezes,  rales, or rhonchi.  No cyanosis, no use of accessory musculature Abdominal: No organomegaly, abdomen is soft and non-tender, positive bowel sounds. No masses. Musculoskeletal: Gait intact. No edema, tenderness Skin: No rashes. Neurologic: Facial musculature symmetric. Psychiatric: Patient acts appropriately throughout our interaction.  Lymphatic: No cervical or submandibular lymphadenopathy Genitourinary/Anorectal: No acute findings  LABS:   EKG/XRAY:   Primary read interpreted by Dr. Everlene Farrier at [facility].   ASSESSMENT/PLAN: Patient qualifies for a 2 year  DOT . I gave him a prescription for #25 sildenafil with 1 refill.I personally performed the services described in this documentation, which was scribed in my presence. The recorded information has been reviewed and is accurate.Robert Novak sideeffects, risk and benefits, and alternatives of medications d/w patient. Patient is aware that all medications have potential sideeffects and we are unable to predict every sideeffect or drug-drug interaction that may occur.  Robert Novak [name] 11/02/2015 10:31 AM

## 2016-02-12 ENCOUNTER — Other Ambulatory Visit: Payer: Self-pay | Admitting: Emergency Medicine

## 2017-01-23 ENCOUNTER — Ambulatory Visit (INDEPENDENT_AMBULATORY_CARE_PROVIDER_SITE_OTHER): Payer: BLUE CROSS/BLUE SHIELD | Admitting: Physician Assistant

## 2017-01-23 ENCOUNTER — Encounter: Payer: Self-pay | Admitting: Physician Assistant

## 2017-01-23 VITALS — BP 170/120 | HR 75 | Temp 97.5°F | Resp 16 | Wt 181.4 lb

## 2017-01-23 DIAGNOSIS — J988 Other specified respiratory disorders: Secondary | ICD-10-CM

## 2017-01-23 DIAGNOSIS — I10 Essential (primary) hypertension: Secondary | ICD-10-CM | POA: Diagnosis not present

## 2017-01-23 DIAGNOSIS — B9689 Other specified bacterial agents as the cause of diseases classified elsewhere: Principal | ICD-10-CM

## 2017-01-23 DIAGNOSIS — N529 Male erectile dysfunction, unspecified: Secondary | ICD-10-CM

## 2017-01-23 MED ORDER — AZITHROMYCIN 250 MG PO TABS: ORAL_TABLET | ORAL | 0 refills | Status: DC

## 2017-01-23 MED ORDER — LOSARTAN POTASSIUM 50 MG PO TABS: 50.0000 mg | ORAL_TABLET | Freq: Every day | ORAL | 0 refills | Status: DC

## 2017-01-23 MED ORDER — SILDENAFIL CITRATE 100 MG PO TABS: 100.0000 mg | ORAL_TABLET | Freq: Every day | ORAL | 1 refills | Status: DC | PRN

## 2017-01-23 MED ORDER — SILDENAFIL CITRATE 20 MG PO TABS: ORAL_TABLET | ORAL | 9 refills | Status: DC

## 2017-01-23 NOTE — Progress Notes (Signed)
Patient ID: Robert Novak MRN: [id number], DOB: 1956-03-18, 61 y.o. Date of Encounter: @DATE @  Chief Complaint:  Chief Complaint  Patient presents with  . Cough  . Headache  . sinus problems  . chest congestion    HPI: 61 y.o. year old male  presents with above.   Says that this has been going on for 2 or 3 weeks. Says that he has had no runny nose and no sneezing. Mostly having cough. Has used some NyQuil so he could get some sleep.  States that he was prescribed "blood pressure medicine in the past but has not been on it in a while ".   Past Medical History:  Diagnosis Date  . Allergy    pollen  . Hyperlipidemia   . Hypertension   . MRSA cellulitis   . Vitamin D deficiency      Home Meds: Outpatient Medications Prior to Visit  Medication Sig Dispense Refill  . GARLIC PO Take by mouth.    . Ginger, Zingiber officinalis, (GINGER PO) Take by mouth.    . sildenafil (REVATIO) 20 MG tablet TAKE 1-2 TABLETS BY MOUTH ONE HOUR PRIOR TO INTERCOURSE 30 tablet 9  . sildenafil (VIAGRA) 100 MG tablet Take 1 tablet (100 mg total) by mouth daily as needed for erectile dysfunction. 6 tablet 1  . guaiFENesin-codeine 100-10 MG/5ML syrup Take 10 mLs by mouth every 6 (six) hours as needed for cough. 180 mL 0  . hydrochlorothiazide (MICROZIDE) 12.5 MG capsule Take 1 capsule (12.5 mg total) by mouth daily. (Patient not taking: Reported on 11/02/2015) 90 capsule 3   No facility-administered medications prior to visit.     Allergies: No Known Allergies  Social History   Social History  . Marital status: Single    Spouse name: N/A  . Number of children: N/A  . Years of education: N/A   Occupational History  . Not on file.   Social History Main Topics  . Smoking status: Never Smoker  . Smokeless tobacco: Never Used  . Alcohol use No  . Drug use: No  . Sexual activity: Not on file   Other Topics Concern  . Not on file   Social History Narrative   Got Married in 2012.    Celesta Gentile already had 2 children.    Pt already had 2 children.    [name], they had an unexpected baby in 2012.       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    No family history on file.   Review of Systems:  See HPI for pertinent ROS. All other ROS negative.    Physical Exam: Blood pressure (!) 170/120, pulse 75, temperature 97.5 F (36.4 C), temperature source Oral, resp. rate 16, weight 181 lb 6.4 oz (82.3 kg), SpO2 97 %., Body mass index is 29.73 kg/m. General: WNWD AAM. Appears in no acute distress. Head: Normocephalic, atraumatic, eyes without discharge, sclera non-icteric, nares are without discharge. Bilateral auditory canals clear, TM's are without perforation, pearly grey and translucent with reflective cone of light bilaterally. Oral cavity moist, posterior pharynx without exudate, erythema, peritonsillar abscess. No tenderness with percussion to frontal or maxillary sinuses bilaterally.  Neck: Supple. No thyromegaly. No lymphadenopathy. Lungs: Clear bilaterally to auscultation without wheezes, rales, or rhonchi. Breathing is unlabored. Heart: RRR with S1 S2. No murmurs, rubs, or gallops. Musculoskeletal:  Strength and tone normal for age. Extremities/Skin: Warm and dry. Neuro: Alert and oriented X 3. Moves all extremities spontaneously. Gait is normal.  CNII-XII grossly in tact. Psych:  Responds to questions appropriately with a normal affect.     ASSESSMENT AND PLAN:  61 y.o. year old male with  1. Bacterial respiratory infection He is to take antibiotic as directed. Follow-up if symptoms do not resolve within 1 week after completion of antibiotic. - azithromycin (ZITHROMAX) 250 MG tablet; Day 1: Take 2 daily. Days 2 -5: Take 1 daily.  Dispense: 6 tablet; Refill: 0  2. Essential hypertension Nurse checked blood pressure in both arms and getting 170/120 bilaterally and with repeat check. He is to take losartan 50 mg daily. He is to return for follow-up office visit in  2 weeks to recheck BP and BME T on medication. - losartan (COZAAR) 50 MG tablet; Take 1 tablet (50 mg total) by mouth daily.  Dispense: 30 tablet; Refill: 0  3. Erectile dysfunction, unspecified erectile dysfunction type Request refill on this. Requests this prescription be printed so he can use coupon/pharmacy with chief for option.--Rx printed. - sildenafil 20mg ---  Follow-up office visit 2 weeks sooner if needed.  Marin Olp Advance, [place], [facility] 01/23/2017 4:28 PM

## 2017-01-25 ENCOUNTER — Telehealth: Payer: Self-pay | Admitting: General Practice

## 2017-01-25 DIAGNOSIS — N529 Male erectile dysfunction, unspecified: Secondary | ICD-10-CM

## 2017-01-25 NOTE — Telephone Encounter (Signed)
Pt pharmacy is calling stating that pt is requesting to change the miligrams on his sildenafil from 20mg  to 100mg     Best number for [facility] [phone]

## 2017-01-28 MED ORDER — SILDENAFIL CITRATE 100 MG PO TABS: 100.0000 mg | ORAL_TABLET | Freq: Every day | ORAL | 11 refills | Status: DC | PRN

## 2017-01-28 NOTE — Telephone Encounter (Signed)
Rx called in to pharmacy. 

## 2017-01-28 NOTE — Telephone Encounter (Signed)
Approved. Viagra 100mg  1 po as directed # 6 + 11

## 2017-01-29 ENCOUNTER — Telehealth: Payer: Self-pay

## 2017-01-29 MED ORDER — LEVOFLOXACIN 750 MG PO TABS: 750.0000 mg | ORAL_TABLET | Freq: Every day | ORAL | 0 refills | Status: DC

## 2017-01-29 NOTE — Telephone Encounter (Signed)
Rx sent to pharmacy pt aware  

## 2017-01-29 NOTE — Telephone Encounter (Signed)
Patient was seen in office on 5/16 for a cough  and was prescribed azithromycin. Patient is req a refill on the azithromycin because he still has his cough and does not want to take OTC medicine because of his b/p. Pls advise

## 2017-01-29 NOTE — Telephone Encounter (Signed)
Levaquin 750mg  1po QD # 7 + 0

## 2017-02-06 ENCOUNTER — Ambulatory Visit: Payer: BLUE CROSS/BLUE SHIELD | Admitting: Physician Assistant

## 2017-02-19 ENCOUNTER — Other Ambulatory Visit: Payer: Self-pay | Admitting: Physician Assistant

## 2017-02-19 DIAGNOSIS — I10 Essential (primary) hypertension: Secondary | ICD-10-CM

## 2017-02-19 NOTE — Telephone Encounter (Signed)
Refill appropriate 

## 2017-03-20 ENCOUNTER — Other Ambulatory Visit: Payer: Self-pay | Admitting: Physician Assistant

## 2017-03-20 DIAGNOSIS — I10 Essential (primary) hypertension: Secondary | ICD-10-CM

## 2017-03-20 NOTE — Telephone Encounter (Signed)
Refill appropriate 

## 2017-10-18 ENCOUNTER — Other Ambulatory Visit: Payer: Self-pay

## 2017-10-18 DIAGNOSIS — N529 Male erectile dysfunction, unspecified: Secondary | ICD-10-CM

## 2017-10-18 MED ORDER — SILDENAFIL CITRATE 100 MG PO TABS: 100.0000 mg | ORAL_TABLET | Freq: Every day | ORAL | 0 refills | Status: DC | PRN

## 2017-10-18 NOTE — Telephone Encounter (Signed)
Refill sent in patient is aware that he need to schedule an appointment to be seen

## 2017-10-18 NOTE — Telephone Encounter (Deleted)
Last OV 01/23/2017 LAST refill  01/28/2017 Okay to refill?

## 2017-10-29 ENCOUNTER — Encounter: Payer: Self-pay | Admitting: Physician Assistant

## 2017-10-29 ENCOUNTER — Ambulatory Visit (INDEPENDENT_AMBULATORY_CARE_PROVIDER_SITE_OTHER): Payer: Self-pay | Admitting: Physician Assistant

## 2017-10-29 VITALS — BP 120/80 | HR 96 | Temp 97.9°F | Resp 17 | Ht 65.0 in | Wt 176.0 lb

## 2017-10-29 DIAGNOSIS — Z0289 Encounter for other administrative examinations: Secondary | ICD-10-CM

## 2017-10-29 NOTE — Progress Notes (Signed)
Commercial Driver Medical Examination   Robert Novak is a 62 y.o. male with a history of HTN who presents today for a commercial driver fitness determination physical exam. The patient reports no problems today. In the past the patient reports receiving 2 year certificates. He denies focal neurological deficits, vision and hearing changes. He denies the habitual use of benzodiazepines, opioids, amphetamines and denies illicit drug use.   Current medications, family history, allergies, social history reviewed by me and exist elsewhere in the encounter.   Review of Systems  Constitutional: Negative for chills, diaphoresis and fever.  Eyes: Negative.   Respiratory: Negative for cough, hemoptysis, sputum production, shortness of breath and wheezing.   Cardiovascular: Negative for chest pain, orthopnea and leg swelling.  Gastrointestinal: Negative for abdominal pain, blood in stool, constipation, diarrhea, heartburn, melena, nausea and vomiting.  Genitourinary: Negative for dysuria, flank pain, frequency, hematuria and urgency.  Skin: Negative for rash.  Neurological: Negative for dizziness, sensory change, speech change, focal weakness and headaches.    Objective:     Vision/hearing:  Visual Acuity Screening   Right eye Left eye Both eyes  Without correction:     With correction: 20/15 20/15 20/15   Hearing Screening Comments: Peripheral Vision: Right eye 85 degrees. Left eye 85 degrees. The patient can distinguish the colors red, amber and green. The patient was able to hear a forced whisper from L=10 R=10 feet.  Applicant can recognize and distinguish among traffic control signals and devices showing standard red, green, and amber colors.  Corrective lenses required: Yes  Monocular Vision?: No  Hearing aid requirement: No  Physical Exam  Constitutional: He is oriented to person, place, and time. He appears well-developed. He is active and cooperative.  Non-toxic appearance.   HENT:  Right Ear: Hearing, tympanic membrane, external ear and ear canal normal.  Left Ear: Hearing, tympanic membrane, external ear and ear canal normal.  Nose: Nose normal. Right sinus exhibits no maxillary sinus tenderness and no frontal sinus tenderness. Left sinus exhibits no maxillary sinus tenderness and no frontal sinus tenderness.  Mouth/Throat: Uvula is midline, oropharynx is clear and moist and mucous membranes are normal. No oropharyngeal exudate, posterior oropharyngeal edema or tonsillar abscesses.  Eyes: Conjunctivae and EOM are normal. Pupils are equal, round, and reactive to light.  Cardiovascular: Normal rate, regular rhythm, S1 normal, S2 normal, normal heart sounds, intact distal pulses and normal pulses. Exam reveals no gallop and no friction rub.  No murmur heard. Pulmonary/Chest: Effort normal. No stridor. No tachypnea. No respiratory distress. He has no wheezes. He has no rales.  Abdominal: Soft. Normal appearance and bowel sounds are normal. He exhibits no distension and no mass. There is no tenderness. There is no rigidity, no rebound, no guarding and no CVA tenderness. No hernia.  Musculoskeletal: He exhibits no edema.  Lymphadenopathy:       Head (right side): No submandibular and no tonsillar adenopathy present.       Head (left side): No submandibular and no tonsillar adenopathy present.    He has no cervical adenopathy.  Neurological: He is alert and oriented to person, place, and time. He has normal strength and normal reflexes. He is not disoriented. No cranial nerve deficit or sensory deficit. He exhibits normal muscle tone. Coordination and gait normal.  Skin: Skin is warm and dry. He is not diaphoretic. No pallor.  Psychiatric: His behavior is normal.  Vitals reviewed.   BP 120/80   Pulse 96   Temp 97.9 F (  36.6 C) (Oral)   Resp 17   Ht 5\' 5"  (1.651 m)   Wt 176 lb (79.8 kg)   SpO2 98%   BMI 29.29 kg/m   Labs: Comments: spgr:1.000 blood:neg,  pro:neg,Blood:neg   Assessment:    Healthy male exam.  Meets standards, but periodic monitoring required due to well controlled HTN.  Driver qualified only for 1 year.    Plan:    Medical examiners certificate completed and printed. Return as needed.    Philis Fendt, MS, PA-C 3:04 PM, 10/29/2017

## 2017-10-29 NOTE — Patient Instructions (Signed)
     IF you received an x-ray today, you will receive an invoice from [facility]. Please contact [facility] at [phone] with questions or concerns regarding your invoice.   IF you received labwork today, you will receive an invoice from [facility]. Please contact [facility] at [phone] with questions or concerns regarding your invoice.   Our billing staff will not be able to assist you with questions regarding bills from these companies.  You will be contacted with the lab results as soon as they are available. The fastest way to get your results is to activate your My Chart account. Instructions are located on the last page of this paperwork. If you have not heard from us regarding the results in 2 weeks, please contact this office.     

## 2017-11-18 ENCOUNTER — Encounter: Payer: BLUE CROSS/BLUE SHIELD | Admitting: Physician Assistant

## 2018-03-26 ENCOUNTER — Ambulatory Visit (INDEPENDENT_AMBULATORY_CARE_PROVIDER_SITE_OTHER): Payer: BLUE CROSS/BLUE SHIELD | Admitting: Physician Assistant

## 2018-03-26 ENCOUNTER — Encounter: Payer: Self-pay | Admitting: Physician Assistant

## 2018-03-26 VITALS — BP 140/98 | HR 92 | Temp 97.7°F | Resp 18 | Ht 65.0 in | Wt 181.6 lb

## 2018-03-26 DIAGNOSIS — N529 Male erectile dysfunction, unspecified: Secondary | ICD-10-CM

## 2018-03-26 DIAGNOSIS — I10 Essential (primary) hypertension: Secondary | ICD-10-CM

## 2018-03-26 MED ORDER — AMLODIPINE BESYLATE 5 MG PO TABS: 5.0000 mg | ORAL_TABLET | Freq: Every day | ORAL | 0 refills | Status: DC

## 2018-03-26 MED ORDER — HYDROCHLOROTHIAZIDE 25 MG PO TABS: 25.0000 mg | ORAL_TABLET | Freq: Every day | ORAL | 0 refills | Status: DC

## 2018-03-26 MED ORDER — SILDENAFIL CITRATE 100 MG PO TABS: 100.0000 mg | ORAL_TABLET | Freq: Every day | ORAL | 3 refills | Status: DC | PRN

## 2018-03-26 MED ORDER — LISINOPRIL 20 MG PO TABS: 20.0000 mg | ORAL_TABLET | Freq: Every day | ORAL | 0 refills | Status: DC

## 2018-03-27 NOTE — Progress Notes (Signed)
Patient ID: Kweku Stankey MRN: [id number], DOB: 10/11/1955, 62 y.o. Date of Encounter: @DATE @  Chief Complaint:  Chief Complaint  Patient presents with  . blood pressure problems    HPI: 62 y.o. year old male  presents with above.   Today I discussed that I have not seen him for a visit in quite a while. He reports that he had no insurance coverage for a while. He reports that he was off medication during that period of time. Reports that he went back on the losartan for 1 week but his blood pressure was still high so he then stopped losartan and started using his wife's lisinopril HCT 20/25 mg 1 daily.  Has taken this for the last few days.  Reports that he has been working full-time for about 1 year.  Has been working a lot.  States that he works about 30 minutes from here-- in [place]. Has had DOT physical but has had no other medical provider/medical evaluation other than coming here.  Has no other specific concerns to address today other than his high blood pressure. Well, except that he also does want refill on his Viagra--- no other additional concerns. (!)   Past Medical History:  Diagnosis Date  . Allergy    pollen  . Hyperlipidemia   . Hypertension   . MRSA cellulitis   . Vitamin D deficiency      Home Meds: Outpatient Medications Prior to Visit  Medication Sig Dispense Refill  . GARLIC PO Take by mouth.    . Ginger, Zingiber officinalis, (GINGER PO) Take by mouth.    . sildenafil (REVATIO) 20 MG tablet TAKE 1-2 TABLETS BY MOUTH ONE HOUR PRIOR TO INTERCOURSE 30 tablet 9  . sildenafil (VIAGRA) 100 MG tablet Take 1 tablet (100 mg total) by mouth daily as needed for erectile dysfunction. 6 tablet 0  . levofloxacin (LEVAQUIN) 750 MG tablet Take 1 tablet (750 mg total) by mouth daily. 7 tablet 0  . losartan (COZAAR) 50 MG tablet TAKE 1 TABLET BY MOUTH EVERY DAY (Patient not taking: Reported on 03/26/2018) 30 tablet 3   No facility-administered medications prior  to visit.     Allergies: No Known Allergies  Social History   Socioeconomic History  . Marital status: Single    Spouse name: Not on file  . Number of children: Not on file  . Years of education: Not on file  . Highest education level: Not on file  Occupational History  . Not on file  Social Needs  . Financial resource strain: Not on file  . Food insecurity:    Worry: Not on file    Inability: Not on file  . Transportation needs:    Medical: Not on file    Non-medical: Not on file  Tobacco Use  . Smoking status: Never Smoker  . Smokeless tobacco: Never Used  Substance and Sexual Activity  . Alcohol use: No  . Drug use: No  . Sexual activity: Not on file  Lifestyle  . Physical activity:    Days per week: Not on file    Minutes per session: Not on file  . Stress: Not on file  Relationships  . Social connections:    Talks on phone: Not on file    Gets together: Not on file    Attends religious service: Not on file    Active member of club or organization: Not on file    Attends meetings of clubs or organizations: Not on  file    Relationship status: Not on file  . Intimate partner violence:    Fear of current or ex partner: Not on file    Emotionally abused: Not on file    Physically abused: Not on file    Forced sexual activity: Not on file  Other Topics Concern  . Not on file  Social History Narrative   Got Married in 2012.    Celesta Gentile already had 2 children.    Pt already had 2 children.    [name], they had an unexpected baby in 2012.       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    History reviewed. No pertinent family history.   Review of Systems:  See HPI for pertinent ROS. All other ROS negative.    Physical Exam: Blood pressure (!) 140/98, pulse 92, temperature 97.7 F (36.5 C), temperature source Oral, resp. rate 18, height 5\' 5"  (1.651 m), weight 82.4 kg (181 lb 9.6 oz), SpO2 97 %., Body mass index is 30.22 kg/m. General:  AAM. Duwaine Maxin,  Speech Impediment. Appears in no acute distress. Neck: Supple. No thyromegaly. No lymphadenopathy. No carotid bruit.  Lungs: Clear bilaterally to auscultation without wheezes, rales, or rhonchi. Breathing is unlabored. Heart: RRR with S1 S2. No murmurs, rubs, or gallops. Musculoskeletal:  Strength and tone normal for age. Extremities/Skin: Warm and dry.  No LE edema.  Neuro: Alert and oriented X 3. Moves all extremities spontaneously. Gait is normal. CNII-XII grossly in tact. Psych:  Responds to questions appropriately with a normal affect.     ASSESSMENT AND PLAN:  62 y.o. year old male with   1. Essential hypertension He is currently taking lisinopril HCT 20/25--- He has taken this for the last 3 days. Will continue these medications but will separate them into 2 separate meds. Since his blood pressure is still elevated on these meds, will also add Norvasc 5 mg in addition. Will have him return for follow-up visit in 2 weeks to recheck BP and bmet. - lisinopril (PRINIVIL,ZESTRIL) 20 MG tablet; Take 1 tablet (20 mg total) by mouth daily.  Dispense: 30 tablet; Refill: 0 - hydrochlorothiazide (HYDRODIURIL) 25 MG tablet; Take 1 tablet (25 mg total) by mouth daily.  Dispense: 30 tablet; Refill: 0 - amLODipine (NORVASC) 5 MG tablet; Take 1 tablet (5 mg total) by mouth daily.  Dispense: 30 tablet; Refill: 0  2. Erectile dysfunction, unspecified erectile dysfunction type He reports that Viagra works well for him.  Will send refill. - sildenafil (VIAGRA) 100 MG tablet; Take 1 tablet (100 mg total) by mouth daily as needed for erectile dysfunction.  Dispense: 6 tablet; Refill: 3  Reviewed his chart and see that he has had no preventive care recently.  I then discussed this with him and discussed possibly doing his next visit as a CPE.  He is agreeable.   We are scheduling a follow-up visit here in 2 weeks as a physical and he is to come fasting.  Marin Olp [place], [place],  [facility] 03/27/2018 8:49 AM

## 2018-04-17 ENCOUNTER — Other Ambulatory Visit: Payer: Self-pay

## 2018-04-17 ENCOUNTER — Other Ambulatory Visit: Payer: Self-pay | Admitting: Physician Assistant

## 2018-04-17 ENCOUNTER — Encounter: Payer: Self-pay | Admitting: Physician Assistant

## 2018-04-17 ENCOUNTER — Encounter: Payer: BLUE CROSS/BLUE SHIELD | Admitting: Physician Assistant

## 2018-04-17 ENCOUNTER — Ambulatory Visit (INDEPENDENT_AMBULATORY_CARE_PROVIDER_SITE_OTHER): Payer: BLUE CROSS/BLUE SHIELD | Admitting: Physician Assistant

## 2018-04-17 VITALS — BP 108/78 | HR 84 | Temp 97.7°F | Resp 18 | Ht 65.0 in | Wt 180.0 lb

## 2018-04-17 DIAGNOSIS — Z23 Encounter for immunization: Secondary | ICD-10-CM | POA: Diagnosis not present

## 2018-04-17 DIAGNOSIS — I10 Essential (primary) hypertension: Secondary | ICD-10-CM

## 2018-04-17 DIAGNOSIS — Z0000 Encounter for general adult medical examination without abnormal findings: Secondary | ICD-10-CM

## 2018-04-17 DIAGNOSIS — N529 Male erectile dysfunction, unspecified: Secondary | ICD-10-CM

## 2018-04-17 NOTE — Progress Notes (Signed)
Patient ID: Robert Novak MRN: [id number], DOB: 07/12/1956 62 y.o. Date of Encounter: 04/17/2018, 9:45 AM    Chief Complaint: Physical (CPE)  HPI: 62 y.o. y/o male here for CPE.    03/26/2018: Today I discussed that I have not seen him for a visit in quite a while. He reports that he had no insurance coverage for a while. He reports that he was off medication during that period of time. Reports that he went back on the losartan for 1 week but his blood pressure was still high so he then stopped losartan and started using his wife's lisinopril HCT 20/25 mg 1 daily.  Has taken this for the last few days.  Reports that he has been working full-time for about 1 year.  Has been working a lot.  States that he works about 30 minutes from here-- in [place]. Has had DOT physical but has had no other medical provider/medical evaluation other than coming here.  Has no other specific concerns to address today other than his high blood pressure. Well, except that he also does want refill on his Viagra--- no other additional concerns. (!)  AT THAT OV 03/26/2018---- ---prescribed lisinopril 20 mg daily, HCTZ 25 mg daily, Norvasc 5 mg daily.  Also prescribed Viagra. At that visit discussed that he had had no preventive care recently.  Discussed with him possibly doing his next visit as a CPE.  He was agreeable.  And to schedule follow-up visit in 2 weeks as a CPE and for him to come fasting to that visit.     04/17/2018:  Today he reports that he did add all 3 blood pressure medications and has been taking all 3 of these daily.  He is having no lightheadedness or other adverse effects. He is fasting today.  Is here to update preventive care.  Has no other specific concerns to address today.     Review of Systems: Consitutional: No fever, chills, fatigue, night sweats, lymphadenopathy, or weight changes. Eyes: No visual changes, eye redness, or discharge. ENT/Mouth: Ears: No otalgia,  tinnitus, hearing loss, discharge. Nose: No congestion, rhinorrhea, sinus pain, or epistaxis. Throat: No sore throat, post nasal drip, or teeth pain. Cardiovascular: No CP, palpitations, diaphoresis, DOE, edema, orthopnea, PND. Respiratory: No cough, hemoptysis, SOB, or wheezing. Gastrointestinal: No anorexia, dysphagia, reflux, pain, nausea, vomiting, hematemesis, diarrhea, constipation, BRBPR, or melena. Genitourinary: No dysuria, frequency, urgency, hematuria, incontinence, nocturia, decreased urinary stream, discharge, impotence, or testicular pain/masses. Musculoskeletal: No decreased ROM, myalgias, stiffness, joint swelling, or weakness. Skin: No rash, erythema, lesion changes, pain, warmth, jaundice, or pruritis. Neurological: No headache, dizziness, syncope, seizures, tremors, memory loss, coordination problems, or paresthesias. Psychological: No anxiety, depression, hallucinations, SI/HI. Endocrine: No fatigue, polydipsia, polyphagia, polyuria, or known diabetes. All other systems were reviewed and are otherwise negative.  Past Medical History:  Diagnosis Date  . Allergy    pollen  . Hyperlipidemia   . Hypertension   . MRSA cellulitis   . Vitamin D deficiency      No past surgical history on file.  Home Meds:  Outpatient Medications Prior to Visit  Medication Sig Dispense Refill  . amLODipine (NORVASC) 5 MG tablet Take 1 tablet (5 mg total) by mouth daily. 30 tablet 0  . GARLIC PO Take by mouth.    . Ginger, Zingiber officinalis, (GINGER PO) Take by mouth.    . hydrochlorothiazide (HYDRODIURIL) 25 MG tablet Take 1 tablet (25 mg total) by mouth daily. 30 tablet 0  .  lisinopril (PRINIVIL,ZESTRIL) 20 MG tablet Take 1 tablet (20 mg total) by mouth daily. 30 tablet 0  . sildenafil (VIAGRA) 100 MG tablet Take 1 tablet (100 mg total) by mouth daily as needed for erectile dysfunction. 6 tablet 3   No facility-administered medications prior to visit.     Allergies: No Known  Allergies  Social History   Socioeconomic History  . Marital status: Single    Spouse name: Not on file  . Number of children: Not on file  . Years of education: Not on file  . Highest education level: Not on file  Occupational History  . Not on file  Social Needs  . Financial resource strain: Not on file  . Food insecurity:    Worry: Not on file    Inability: Not on file  . Transportation needs:    Medical: Not on file    Non-medical: Not on file  Tobacco Use  . Smoking status: Never Smoker  . Smokeless tobacco: Never Used  Substance and Sexual Activity  . Alcohol use: No  . Drug use: No  . Sexual activity: Not on file  Lifestyle  . Physical activity:    Days per week: Not on file    Minutes per session: Not on file  . Stress: Not on file  Relationships  . Social connections:    Talks on phone: Not on file    Gets together: Not on file    Attends religious service: Not on file    Active member of club or organization: Not on file    Attends meetings of clubs or organizations: Not on file    Relationship status: Not on file  . Intimate partner violence:    Fear of current or ex partner: Not on file    Emotionally abused: Not on file    Physically abused: Not on file    Forced sexual activity: Not on file  Other Topics Concern  . Not on file  Social History Narrative   Got Married in 2012.    Celesta Gentile already had 2 children.    Pt already had 2 children.    [name], they had an unexpected baby in 2012.       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    No family history on file.  Physical Exam: Blood pressure 108/78, pulse 84, temperature 97.7 F (36.5 C), temperature source Oral, resp. rate 18, height 5\' 5"  (1.651 m), weight 81.6 kg, SpO2 97 %.  General: Well developed, well nourished AAM. Appears in no acute distress. HEENT: Normocephalic, atraumatic. Conjunctiva pink, sclera non-icteric. Pupils 2 mm constricting to 1 mm, round, regular, and equally  reactive to light and accomodation. EOMI. Internal auditory canal clear. TMs with good cone of light and without pathology. Nasal mucosa pink. Nares are without discharge. No sinus tenderness. Oral mucosa pink.  Neck: Supple. Trachea midline. No thyromegaly. Full ROM. No lymphadenopathy. Lungs: Clear to auscultation bilaterally without wheezes, rales, or rhonchi. Breathing is of normal effort and unlabored. Cardiovascular: RRR with S1 S2. No murmurs, rubs, or gallops. Distal pulses 2+ symmetrically. No carotid or abdominal bruits. Abdomen: Soft, non-tender, non-distended with normoactive bowel sounds. No hepatosplenomegaly or masses. No rebound/guarding. No CVA tenderness. No hernias. Musculoskeletal: Full range of motion and 5/5 strength throughout.  Skin: Warm and moist without erythema, ecchymosis, wounds, or rash. Neuro: A+Ox3. CN II-XII grossly intact. Moves all extremities spontaneously. Full sensation throughout. Normal gait.  Psych:  Responds to questions appropriately with a  normal affect.   Assessment/Plan:  62 y.o. y/o  male here for CPE  1. Encounter for preventive health examination  A. Screening Labs: - CBC with Differential/Platelet - COMPLETE METABOLIC PANEL WITH GFR - Lipid panel - TSH - PSA  B. Screening For Prostate Cancer: - PSA   C. Screening For Colorectal Cancer:  He has had one colonoscopy before.  This was performed in 2012 followed by [facility], Dr. Carlean Purl.  That colonoscopy did reveal polyps and the report states to repeat 12/2013.  This was with Dr. Carlean Purl.  Today patient reports that he was not aware that he was overdue for follow-up.  Reports that he has not had follow-up.  He is agreeable for me to schedule follow-up.  I have placed order and have added comment that to schedule with Dr. Carlean Purl with [facility]. - Ambulatory referral to Gastroenterology  D. Immunizations: Flu--------------------N/A Tetanus-------- last tetanus vaccine was greater than  10 years ago.  He is agreeable to update today.  Tdap given here 04/17/2018 Pneumococcal--- he has no indication to require pneumonia vaccine until age 53. Shingrix------------ discussed Shingrix.  He is to check with his insurance regarding coverage/cost and if he wants to proceed will get this at pharmacy.   2. Essential hypertension Blood pressure is at goal/controlled.  Continue current medications.  Check lab to monitor. - COMPLETE METABOLIC PANEL WITH GFR  3. Erectile dysfunction, unspecified erectile dysfunction type Prescribed Viagra 03/26/2018 ---to use as directed.   Will plan for routine follow-up visit 6 months.  Follow-up sooner if needed.     Signed:   [address] [place], [place]  04/17/2018 9:45 AM

## 2018-04-17 NOTE — Addendum Note (Signed)
Addended by: Vonna Kotyk A on: 04/17/2018 04:36 PM   Modules accepted: Orders

## 2018-04-18 LAB — COMPLETE METABOLIC PANEL WITH GFR
AG RATIO: 1.8 (calc) (ref 1.0–2.5)
ALT: 31 U/L (ref 9–46)
AST: 24 U/L (ref 10–35)
Albumin: 4.3 g/dL (ref 3.6–5.1)
Alkaline phosphatase (APISO): 71 U/L (ref 40–115)
BUN / CREAT RATIO: 16 (calc) (ref 6–22)
BUN: 21 mg/dL (ref 7–25)
CO2: 26 mmol/L (ref 20–32)
Calcium: 9.2 mg/dL (ref 8.6–10.3)
Chloride: 102 mmol/L (ref 98–110)
Creat: 1.3 mg/dL — ABNORMAL HIGH (ref 0.70–1.25)
GFR, EST AFRICAN AMERICAN: 69 mL/min/{1.73_m2} (ref >=60)
GFR, EST NON AFRICAN AMERICAN: 59 mL/min/{1.73_m2} — AB (ref >=60)
Globulin: 2.4 g/dL (calc) (ref 1.9–3.7)
Glucose, Bld: 90 mg/dL (ref 65–99)
POTASSIUM: 4.3 mmol/L (ref 3.5–5.3)
Sodium: 136 mmol/L (ref 135–146)
TOTAL PROTEIN: 6.7 g/dL (ref 6.1–8.1)
Total Bilirubin: 0.7 mg/dL (ref 0.2–1.2)

## 2018-04-18 LAB — CBC WITH DIFFERENTIAL/PLATELET
Basophils Absolute: 31 cells/uL (ref 0–200)
Basophils Relative: 0.6 %
Eosinophils Absolute: 218 cells/uL (ref 15–500)
Eosinophils Relative: 4.2 %
HCT: 48.1 % (ref 38.5–50.0)
Hemoglobin: 16.5 g/dL (ref 13.2–17.1)
LYMPHS ABS: 1238 {cells}/uL (ref 850–3900)
MCH: 26.4 pg — ABNORMAL LOW (ref 27.0–33.0)
MCHC: 34.3 g/dL (ref 32.0–36.0)
MCV: 77.1 fL — AB (ref 80.0–100.0)
MPV: 9.4 fL (ref 7.5–12.5)
Monocytes Relative: 13.6 %
NEUTROS ABS: 3006 {cells}/uL (ref 1500–7800)
Neutrophils Relative %: 57.8 %
Platelets: 207 10*3/uL (ref 140–400)
RBC: 6.24 10*6/uL — ABNORMAL HIGH (ref 4.20–5.80)
RDW: 14.3 % (ref 11.0–15.0)
Total Lymphocyte: 23.8 %
WBC: 5.2 10*3/uL (ref 3.8–10.8)
WBCMIX: 707 {cells}/uL (ref 200–950)

## 2018-04-18 LAB — LIPID PANEL
CHOL/HDL RATIO: 5.7 (calc) — AB (ref <5.0)
Cholesterol: 240 mg/dL — ABNORMAL HIGH (ref <200)
HDL: 42 mg/dL (ref >40)
LDL Cholesterol (Calc): 177 mg/dL (calc) — ABNORMAL HIGH
NON-HDL CHOLESTEROL (CALC): 198 mg/dL — AB (ref <130)
Triglycerides: 95 mg/dL (ref <150)

## 2018-04-18 LAB — TSH: TSH: 1.53 m[IU]/L (ref 0.40–4.50)

## 2018-04-18 LAB — PSA: PSA: 0.6 ng/mL (ref <=4.0)

## 2018-04-21 ENCOUNTER — Other Ambulatory Visit: Payer: Self-pay

## 2018-04-21 DIAGNOSIS — E785 Hyperlipidemia, unspecified: Secondary | ICD-10-CM

## 2018-04-21 MED ORDER — SIMVASTATIN 10 MG PO TABS: 10.0000 mg | ORAL_TABLET | Freq: Every day | ORAL | 1 refills | Status: DC

## 2018-04-23 ENCOUNTER — Encounter: Payer: Self-pay | Admitting: Internal Medicine

## 2018-05-03 ENCOUNTER — Other Ambulatory Visit: Payer: Self-pay | Admitting: Physician Assistant

## 2018-05-17 ENCOUNTER — Other Ambulatory Visit: Payer: Self-pay | Admitting: Physician Assistant

## 2018-05-17 DIAGNOSIS — I10 Essential (primary) hypertension: Secondary | ICD-10-CM

## 2018-06-03 ENCOUNTER — Other Ambulatory Visit: Payer: Self-pay | Admitting: Physician Assistant

## 2018-06-03 DIAGNOSIS — I10 Essential (primary) hypertension: Secondary | ICD-10-CM

## 2018-06-06 ENCOUNTER — Other Ambulatory Visit: Payer: Self-pay | Admitting: Physician Assistant

## 2018-06-06 DIAGNOSIS — I10 Essential (primary) hypertension: Secondary | ICD-10-CM

## 2018-06-10 ENCOUNTER — Ambulatory Visit: Payer: Self-pay | Admitting: Family Medicine

## 2018-06-11 DIAGNOSIS — R05 Cough: Secondary | ICD-10-CM | POA: Diagnosis not present

## 2018-06-11 DIAGNOSIS — R509 Fever, unspecified: Secondary | ICD-10-CM | POA: Diagnosis not present

## 2018-06-11 DIAGNOSIS — J069 Acute upper respiratory infection, unspecified: Secondary | ICD-10-CM | POA: Diagnosis not present

## 2018-06-23 ENCOUNTER — Telehealth: Payer: Self-pay

## 2018-06-23 NOTE — Telephone Encounter (Signed)
Patient called and left a message stating his Cialis was not strong enough for him and was requesting a rx for something stronger. I called patient lvm for him to return my call

## 2018-07-02 ENCOUNTER — Telehealth: Payer: Self-pay

## 2018-07-02 NOTE — Telephone Encounter (Signed)
Patient called and states he is having problems with his bowels. Patient states he is having a hard time getting his bowels to move. It is causing abdominal pain as well as rectum soreness.  Patient was instructed to try Dulcolax or Miralax as well as eat foods high in fiber, drink  lots of water, eat fresh fruits and vegetables to get his bowels moving.Patient denies having any vomiting or nausea. Patient states he will try the laxative and if that does not work then he will make an appointment to be seen.

## 2018-07-11 ENCOUNTER — Encounter: Payer: PRIVATE HEALTH INSURANCE | Admitting: Internal Medicine

## 2018-07-12 DIAGNOSIS — M10071 Idiopathic gout, right ankle and foot: Secondary | ICD-10-CM | POA: Diagnosis not present

## 2019-06-09 ENCOUNTER — Encounter: Payer: Self-pay | Admitting: Family Medicine

## 2019-06-09 ENCOUNTER — Ambulatory Visit (INDEPENDENT_AMBULATORY_CARE_PROVIDER_SITE_OTHER): Payer: BC Managed Care – PPO | Admitting: Family Medicine

## 2019-06-09 ENCOUNTER — Other Ambulatory Visit: Payer: Self-pay

## 2019-06-09 VITALS — BP 136/78 | HR 90 | Temp 97.8°F | Resp 16 | Ht 65.0 in | Wt 181.0 lb

## 2019-06-09 DIAGNOSIS — Z1211 Encounter for screening for malignant neoplasm of colon: Secondary | ICD-10-CM

## 2019-06-09 DIAGNOSIS — Z125 Encounter for screening for malignant neoplasm of prostate: Secondary | ICD-10-CM | POA: Diagnosis not present

## 2019-06-09 DIAGNOSIS — N529 Male erectile dysfunction, unspecified: Secondary | ICD-10-CM

## 2019-06-09 DIAGNOSIS — I10 Essential (primary) hypertension: Secondary | ICD-10-CM | POA: Diagnosis not present

## 2019-06-09 DIAGNOSIS — Z860100 Personal history of colon polyps, unspecified: Secondary | ICD-10-CM

## 2019-06-09 DIAGNOSIS — E669 Obesity, unspecified: Secondary | ICD-10-CM | POA: Insufficient documentation

## 2019-06-09 DIAGNOSIS — E785 Hyperlipidemia, unspecified: Secondary | ICD-10-CM

## 2019-06-09 MED ORDER — AMLODIPINE BESYLATE 5 MG PO TABS: 5.0000 mg | ORAL_TABLET | Freq: Every day | ORAL | 1 refills | Status: DC

## 2019-06-09 MED ORDER — TADALAFIL 20 MG PO TABS: ORAL_TABLET | ORAL | 1 refills | Status: DC

## 2019-06-09 NOTE — Assessment & Plan Note (Signed)
Restart norvasc 5mg  once a day Check CBC./CMET Plan for lipid at next visit

## 2019-06-09 NOTE — Assessment & Plan Note (Signed)
Likley MTF, may low testosterone but he also is hypertensive and this can affect his erections.  I sent in Cialis he is going to see if he can get this at a cheaper price.  He would like to try this over the Viagra again.  We will also check his PSA in case he does go on testosterone therapy.

## 2019-06-09 NOTE — Patient Instructions (Addendum)
Labuer for colonoscopy  We will call with lab results  Restart norvasc 5mg  once a day   F/U 4 months for Physcal

## 2019-06-09 NOTE — Assessment & Plan Note (Signed)
Discussed dietary changes.

## 2019-06-09 NOTE — Progress Notes (Signed)
   Subjective:    Patient ID: Robert Novak, male    DOB: 1956/05/08, 63 y.o.   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  ED- continues to have problems, keeping his erection, last on sildenafil 40mg  , this is not working, now cant get an erection now but denies any premature ejaculation.  To have his testosterone level checked.  History of colon polyps he would like to proceed with colonoscopy he is overdue for this.  Eye doctor- [facility]    Review Of Systems:  GEN- denies fatigue, fever, weight loss,weakness, recent illness HEENT- denies eye drainage, change in vision, nasal discharge, CVS- denies chest pain, palpitations RESP- denies SOB, cough, wheeze ABD- denies N/V, change in stools, abd pain GU- denies dysuria, hematuria, dribbling, incontinence MSK- denies joint pain, muscle aches, injury Neuro- denies headache, dizziness, syncope, seizure activity       Objective:    BP 136/78   Pulse 90   Temp 97.8 F (36.6 C) (Oral)   Resp 16   Ht 5\' 5"  (1.651 m)   Wt 181 lb (82.1 kg)   SpO2 96%   BMI 30.12 kg/m  GEN- NAD, alert and oriented x3 HEENT- PERRL, EOMI, non injected sclera, pink conjunctiva, MMM, oropharynx clear Neck- Supple, no thyromegaly CVS- RRR, no murmur RESP-CTAB ABD-NABS,soft,NT,ND EXT- No edema Pulses- Radial, DP- 2+        Assessment & Plan:      Problem List Items Addressed This Visit      Unprioritized   Erectile dysfunction    Likley MTF, may low testosterone but he also is hypertensive and this can affect his erections.  I  sent in Cialis he is going to see if he can get this at a cheaper price.  He would like to try this over the Viagra again.  We will also check his PSA in case he does go on testosterone therapy.      Relevant Orders   PSA   Testosterone   Hyperlipidemia   Relevant Medications   amLODipine (NORVASC) 5 MG tablet   Hypertension - Primary    Restart norvasc 5mg  once a day Check CBC./CMET Plan for lipid at next visit        Relevant Medications   amLODipine (NORVASC) 5 MG tablet   Other Relevant Orders   CBC with Differential/Platelet   Comprehensive metabolic panel   Obesity (BMI 30-39.9)    Discussed dietary changes       Personal history of colonic polyps   Relevant Orders   Ambulatory referral to Gastroenterology    Other Visit Diagnoses    Prostate cancer screening       Relevant Orders   PSA   Testosterone   Colon cancer screening       Relevant Orders   Ambulatory referral to Gastroenterology      Note: This dictation was prepared with Dragon dictation along with smaller phrase technology. Any transcriptional errors that result from this process are unintentional.

## 2019-06-10 ENCOUNTER — Ambulatory Visit: Payer: BLUE CROSS/BLUE SHIELD | Admitting: Family Medicine

## 2019-06-10 LAB — COMPREHENSIVE METABOLIC PANEL
AG Ratio: 1.9 (calc) (ref 1.0–2.5)
ALT: 24 U/L (ref 9–46)
AST: 22 U/L (ref 10–35)
Albumin: 4.2 g/dL (ref 3.6–5.1)
Alkaline phosphatase (APISO): 71 U/L (ref 35–144)
BUN/Creatinine Ratio: 14 (calc) (ref 6–22)
BUN: 24 mg/dL (ref 7–25)
CO2: 23 mmol/L (ref 20–32)
Calcium: 9.3 mg/dL (ref 8.6–10.3)
Chloride: 102 mmol/L (ref 98–110)
Creat: 1.67 mg/dL — ABNORMAL HIGH (ref 0.70–1.25)
Globulin: 2.2 g/dL (calc) (ref 1.9–3.7)
Glucose, Bld: 81 mg/dL (ref 65–99)
Potassium: 4.4 mmol/L (ref 3.5–5.3)
Sodium: 137 mmol/L (ref 135–146)
Total Bilirubin: 0.6 mg/dL (ref 0.2–1.2)
Total Protein: 6.4 g/dL (ref 6.1–8.1)

## 2019-06-10 LAB — TESTOSTERONE: Testosterone: 218 ng/dL — ABNORMAL LOW (ref 250–827)

## 2019-06-10 LAB — CBC WITH DIFFERENTIAL/PLATELET
Absolute Monocytes: 754 cells/uL (ref 200–950)
Basophils Absolute: 39 cells/uL (ref 0–200)
Basophils Relative: 0.7 %
Eosinophils Absolute: 303 cells/uL (ref 15–500)
Eosinophils Relative: 5.5 %
HCT: 49.1 % (ref 38.5–50.0)
Hemoglobin: 16.3 g/dL (ref 13.2–17.1)
Lymphs Abs: 1375 cells/uL (ref 850–3900)
MCH: 26.3 pg — ABNORMAL LOW (ref 27.0–33.0)
MCHC: 33.2 g/dL (ref 32.0–36.0)
MCV: 79.2 fL — ABNORMAL LOW (ref 80.0–100.0)
MPV: 10.1 fL (ref 7.5–12.5)
Monocytes Relative: 13.7 %
Neutro Abs: 3031 cells/uL (ref 1500–7800)
Neutrophils Relative %: 55.1 %
Platelets: 200 10*3/uL (ref 140–400)
RBC: 6.2 10*6/uL — ABNORMAL HIGH (ref 4.20–5.80)
RDW: 14.8 % (ref 11.0–15.0)
Total Lymphocyte: 25 %
WBC: 5.5 10*3/uL (ref 3.8–10.8)

## 2019-06-10 LAB — PSA: PSA: 0.5 ng/mL (ref <=4.0)

## 2019-06-11 ENCOUNTER — Telehealth: Payer: Self-pay | Admitting: Family Medicine

## 2019-06-11 NOTE — Telephone Encounter (Signed)
[name] Robert Novak  Patient calling to say that he would like to get the tadalafil If possible

## 2019-06-11 NOTE — Telephone Encounter (Signed)
Ok to order?  Patient is requesting either 10-20 mg.   Cialis is affordable with coupon from [facility] at [facility] [employer] ($24.00 for #30 tabs).

## 2019-06-12 MED ORDER — TADALAFIL 20 MG PO TABS: ORAL_TABLET | ORAL | 2 refills | Status: DC

## 2019-06-12 MED ORDER — TADALAFIL 20 MG PO TABS: ORAL_TABLET | ORAL | 1 refills | Status: DC

## 2019-06-12 NOTE — Telephone Encounter (Signed)
Call placed to patient and patient made aware.  

## 2019-06-12 NOTE — Telephone Encounter (Signed)
Prescription sent He should only take 1 dose in a 48 hour period

## 2019-06-17 ENCOUNTER — Telehealth: Payer: Self-pay | Admitting: *Deleted

## 2019-06-17 DIAGNOSIS — I10 Essential (primary) hypertension: Secondary | ICD-10-CM

## 2019-06-17 MED ORDER — AMLODIPINE BESYLATE 10 MG PO TABS: 10.0000 mg | ORAL_TABLET | Freq: Every day | ORAL | 3 refills | Status: DC

## 2019-06-17 NOTE — Telephone Encounter (Signed)
Increase norvasc to 10mg  once a day OV in 2 weeks if he isnt scheduled already

## 2019-06-17 NOTE — Telephone Encounter (Signed)
Received call from patient.   Reports that BP remains in 120-130/ 90-100 range, but he does not have actual readings. Advised to monitor BP daily and record readings. States that he has been taking his BP QD.   Also states that he has increased water intake, decreased juice/ soda intake, has begun daily walking, and is eating healthier while on the road (more salads).   Reports that he does not think medication is high enough.

## 2019-06-17 NOTE — Telephone Encounter (Signed)
Call placed to patient and patient made aware.   Prescription sent to pharmacy.  

## 2019-06-17 NOTE — Telephone Encounter (Signed)
Received VM from patient.   Reports that BP noted at 120/92. States that he has been taking Amlodipine 5mg  PO QD, but feels that his diastolic BP is still too high. Requested MD to advise.   Call placed to patient to determine if any other BP readings are available. [place].

## 2019-07-10 ENCOUNTER — Encounter: Payer: Self-pay | Admitting: Family Medicine

## 2019-07-13 ENCOUNTER — Other Ambulatory Visit: Payer: Self-pay

## 2019-07-13 ENCOUNTER — Encounter: Payer: Self-pay | Admitting: Family Medicine

## 2019-07-13 ENCOUNTER — Ambulatory Visit (INDEPENDENT_AMBULATORY_CARE_PROVIDER_SITE_OTHER): Payer: BC Managed Care – PPO | Admitting: Family Medicine

## 2019-07-13 VITALS — BP 122/80 | HR 99 | Temp 97.4°F | Resp 18 | Ht 64.0 in | Wt 174.8 lb

## 2019-07-13 DIAGNOSIS — E785 Hyperlipidemia, unspecified: Secondary | ICD-10-CM | POA: Diagnosis not present

## 2019-07-13 DIAGNOSIS — R7989 Other specified abnormal findings of blood chemistry: Secondary | ICD-10-CM

## 2019-07-13 DIAGNOSIS — I10 Essential (primary) hypertension: Secondary | ICD-10-CM

## 2019-07-13 DIAGNOSIS — Z13220 Encounter for screening for lipoid disorders: Secondary | ICD-10-CM | POA: Diagnosis not present

## 2019-07-13 MED ORDER — LISINOPRIL-HYDROCHLOROTHIAZIDE 20-25 MG PO TABS: 1.0000 | ORAL_TABLET | Freq: Every day | ORAL | 3 refills | Status: DC

## 2019-07-13 MED ORDER — TADALAFIL 20 MG PO TABS: ORAL_TABLET | ORAL | 1 refills | Status: DC

## 2019-07-13 NOTE — Progress Notes (Addendum)
   Subjective:    Patient ID: Robert Novak, male    DOB: 02/14/1956, 63 y.o.   MRN: [id number]  Patient presents for Hypertension (follow up) Patient here for interim follow-up on his blood pressure and renal function.  At his last visit I restarted him on amlodipine 5 mg due to hypertension however he states that he only took 2 doses and then he started taking lisinopril HCTZ 20/25 mg which she had at home.  His creatinine came back at 1.67 on his labs which was also new.  He has changed his diet significantly has lost 6 pounds intentionally.  States that he is exercising regularly as well.  He feels well no chest pain no shortness of breath.  He does not want to go on testosterone replacement for his low T he has been using the Cialis but this does not work as well.  Blood pressures at home have ranged from 105-133/62-83   pt had soup and canteloupe/ Salad today   Review Of Systems:  GEN- denies fatigue, fever, weight loss,weakness, recent illness HEENT- denies eye drainage, change in vision, nasal discharge, CVS- denies chest pain, palpitations RESP- denies SOB, cough, wheeze ABD- denies N/V, change in stools, abd pain GU- denies dysuria, hematuria, dribbling, incontinence MSK- denies joint pain, muscle aches, injury Neuro- denies headache, dizziness, syncope, seizure activity       Objective:    BP 122/80 (BP Location: Right Arm, Patient Position: Sitting, Cuff Size: Normal)   Pulse 99   Temp (!) 97.4 F (36.3 C) (Oral)   Resp 18   Ht 5\' 4"  (1.626 m)   Wt 174 lb 12.8 oz (79.3 kg)   SpO2 98%   BMI 30.00 kg/m  GEN- NAD, alert and oriented x3 HEENT- PERRL, EOMI, non injected sclera, pink conjunctiva, MMM, oropharynx clear CVS- RRR, no murmur RESP-CTAB EXT- No edema Pulses- Radial,2+        Assessment & Plan:      Problem List Items Addressed This Visit      Unprioritized   Hyperlipidemia   Relevant Medications   lisinopril (ZESTRIL) 20 MG tablet   tadalafil  (CIALIS) 20 MG tablet   lisinopril-hydrochlorothiazide (ZESTORETIC) 20-25 MG tablet   Other Relevant Orders   Lipid panel   Hypertension - Primary    Blood pressure is improved however am concerned with him being on an ACE inhibitor and a diuretic that his renal function may worsen on this combination.  We will recheck his labs today.  If this is still elevated he needs to switch back to the amlodipine for his blood pressure control in order to help preserve kidney function.  Tinea with his healthy eating and dietary changes.  In the low testosterone I will recheck so that we have another level signifying a low testosterone.  His PSA was normal.  If everything else looks good he would be a good candidate for AndroGel.      Relevant Medications   lisinopril (ZESTRIL) 20 MG tablet   tadalafil (CIALIS) 20 MG tablet   lisinopril-hydrochlorothiazide (ZESTORETIC) 20-25 MG tablet   Other Relevant Orders   Basic metabolic panel    Other Visit Diagnoses    Low testosterone       Relevant Orders   Testosterone      Note: This dictation was prepared with Dragon dictation along with smaller phrase technology. Any transcriptional errors that result from this process are unintentional.

## 2019-07-13 NOTE — Assessment & Plan Note (Signed)
Blood pressure is improved however am concerned with him being on an ACE inhibitor and a diuretic that his renal function may worsen on this combination.  We will recheck his labs today.  If this is still elevated he needs to switch back to the amlodipine for his blood pressure control in order to help preserve kidney function.  Tinea with his healthy eating and dietary changes.  In the low testosterone I will recheck so that we have another level signifying a low testosterone.  His PSA was normal.  If everything else looks good he would be a good candidate for AndroGel.

## 2019-07-13 NOTE — Patient Instructions (Signed)
F/U 4 months  

## 2019-07-14 LAB — BASIC METABOLIC PANEL
BUN/Creatinine Ratio: 16 (calc) (ref 6–22)
BUN: 24 mg/dL (ref 7–25)
CO2: 26 mmol/L (ref 20–32)
Calcium: 9.4 mg/dL (ref 8.6–10.3)
Chloride: 99 mmol/L (ref 98–110)
Creat: 1.51 mg/dL — ABNORMAL HIGH (ref 0.70–1.25)
Glucose, Bld: 81 mg/dL (ref 65–99)
Potassium: 4.2 mmol/L (ref 3.5–5.3)
Sodium: 134 mmol/L — ABNORMAL LOW (ref 135–146)

## 2019-07-14 LAB — LIPID PANEL
Cholesterol: 224 mg/dL — ABNORMAL HIGH (ref <200)
HDL: 44 mg/dL (ref >=40)
LDL Cholesterol (Calc): 157 mg/dL (calc) — ABNORMAL HIGH
Non-HDL Cholesterol (Calc): 180 mg/dL (calc) — ABNORMAL HIGH (ref <130)
Total CHOL/HDL Ratio: 5.1 (calc) — ABNORMAL HIGH (ref <5.0)
Triglycerides: 115 mg/dL (ref <150)

## 2019-07-14 LAB — EXTRA LAV TOP TUBE

## 2019-07-14 LAB — TESTOSTERONE: Testosterone: 332 ng/dL (ref 250–827)

## 2019-07-14 NOTE — Addendum Note (Signed)
Addended by: Vic Blackbird F on: 07/14/2019 04:47 PM   Modules accepted: Orders

## 2019-07-15 ENCOUNTER — Other Ambulatory Visit: Payer: Self-pay

## 2019-07-15 MED ORDER — TADALAFIL 20 MG PO TABS: ORAL_TABLET | ORAL | 1 refills | Status: AC

## 2019-07-15 MED ORDER — ATORVASTATIN CALCIUM 10 MG PO TABS: 10.0000 mg | ORAL_TABLET | Freq: Every day | ORAL | 0 refills | Status: DC

## 2019-07-15 NOTE — Addendum Note (Signed)
Addended by: Vic Blackbird F on: 07/15/2019 12:16 PM   Modules accepted: Orders

## 2019-09-25 ENCOUNTER — Encounter: Payer: BC Managed Care – PPO | Admitting: Family Medicine

## 2019-10-12 ENCOUNTER — Encounter: Payer: BC Managed Care – PPO | Admitting: Family Medicine

## 2019-10-28 ENCOUNTER — Telehealth: Payer: Self-pay | Admitting: *Deleted

## 2019-10-28 NOTE — Telephone Encounter (Signed)
Received call from patient.   Reports that he has been having elevated BP readings.   States that his BP is ranging in 130-150/ 90-110. Reports that he has DOT PE today at 10am and is concerned he will not pass due to his BP.  Upon review, patient is taking BP immediately after waking with wrist cuff. He does not repeat BP after taking Amlodipine.   Advised to take medication every day. Advised to check BP 1 hour after medication. Reports that he has not taken medication today. States that he will take medication and return call with today's reading.   Advised that if elevated BP noted at PE, DOT will usually give temporary license and have patient F/U with PCP. Requested to schedule appointment for 10/30/2019. Appointment scheduled.

## 2019-10-28 NOTE — Telephone Encounter (Signed)
Noted  

## 2019-10-30 ENCOUNTER — Other Ambulatory Visit: Payer: Self-pay

## 2019-10-30 ENCOUNTER — Encounter: Payer: Self-pay | Admitting: Family Medicine

## 2019-10-30 ENCOUNTER — Ambulatory Visit (INDEPENDENT_AMBULATORY_CARE_PROVIDER_SITE_OTHER): Payer: BC Managed Care – PPO | Admitting: Family Medicine

## 2019-10-30 VITALS — BP 144/90 | HR 86 | Temp 98.8°F | Resp 16 | Ht 64.0 in | Wt 185.0 lb

## 2019-10-30 DIAGNOSIS — E669 Obesity, unspecified: Secondary | ICD-10-CM | POA: Diagnosis not present

## 2019-10-30 DIAGNOSIS — I10 Essential (primary) hypertension: Secondary | ICD-10-CM | POA: Diagnosis not present

## 2019-10-30 DIAGNOSIS — N182 Chronic kidney disease, stage 2 (mild): Secondary | ICD-10-CM

## 2019-10-30 DIAGNOSIS — M542 Cervicalgia: Secondary | ICD-10-CM

## 2019-10-30 MED ORDER — AMLODIPINE BESYLATE 10 MG PO TABS: 10.0000 mg | ORAL_TABLET | Freq: Every day | ORAL | 3 refills | Status: DC

## 2019-10-30 MED ORDER — TIZANIDINE HCL 4 MG PO TABS: 4.0000 mg | ORAL_TABLET | Freq: Two times a day (BID) | ORAL | 0 refills | Status: DC | PRN

## 2019-10-30 NOTE — Progress Notes (Signed)
Subjective:    Patient ID: Robert Novak, male    DOB: [birth year], 64 y.o.   MRN: [id number]  Patient presents for HTN (BP elevated) and Should Pain (R sided pain from neck to shoulder) Patient here to follow-up blood pressure.  His blood pressure has been elevated the last few checks at home.  He is concerned because of his DOT.  At her last visit back in the fall he was changed back to amlodipine 10 mg once a day secondary to chronic renal failure.  His last creatinine was 1.5 and he was on lisinopril HCTZ and had actually spiked up to 1.67 on that medication.  He denies any chest pain shortness of breath.  Today his bottles show he has only been taking norvasc 5mg , at his DOT, BP was  130's/100  Hyperlipidemia  I recommended he start statin drug Lipitor due to elevated LDL 157 and TC 224 in setting of CKD and HTN, increased risk of heart disease   He had right sided neck pain started on Monday, so he was taking aleve and his blood pressure  He was moving some items at work,, feels like he may have twisted the wrong way and woke up sore on right side and top of shoulder  If he moves a certain way or he has pain , tough it has improved throughout the week, he was using some muscle rub No tingling or numbness in ext   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 and think that that got him off track with his exercise and nutrition his weight is up 10 pounds since her last visit in the fall    Review Of Systems:  GEN- denies fatigue, fever, weight loss,weakness, recent illness HEENT- denies eye drainage, change in vision, nasal discharge, CVS- denies chest pain, palpitations RESP- denies SOB, cough, wheeze ABD- denies N/V, change in stools, abd pain GU- denies dysuria, hematuria, dribbling, incontinence MSK- + joint pain, muscle aches, injury Neuro- denies headache, dizziness, syncope, seizure activity       Objective:    BP (!) 144/90 (BP Location:  Right Arm, Patient Position: Sitting, Cuff Size: Large)   Pulse 86   Temp 98.8 F (37.1 C) (Temporal)   Resp 16   Ht 5\' 4"  (1.626 m)   Wt 185 lb (83.9 kg)   SpO2 98%   BMI 31.76 kg/m  GEN- NAD, alert and oriented x3 HEENT- PERRL, EOMI, non injected sclera, pink conjunctiva, MMM, oropharynx clear Neck- Supple, FROM, C spine NT, neg spurlings  CVS- RRR, no murmur RESP-CTAB MSK- FROM upper ext, mild TTP and spasm across shoulder, right cervical paraspinals, Rotator cuff in tact  EXT- No edema Pulses- Radial,  2+        Assessment & Plan:      Problem List Items Addressed This Visit      Unprioritized   CKD (chronic kidney disease) stage 2, GFR 60-89 ml/min   Relevant Orders   BASIC METABOLIC PANEL WITH GFR   CBC with Differential/Platelet   Hypertension    Uncontrolled blood pressure.  Increase amlodipine to 10 mg once a day.  Goal is blood pressure less than 140/90.  He is going to check his blood pressure at home we will call him Monday with his lab results and he can give [place] the readings.  Neck step is adding metoprolol 12.5 mg once a day.  We will check his renal function as well.  The  shoulder neck discomfort I think this is just musculoskeletal inflammation.  No red flags on exam.  He was given Zanaflex to use for spasm he can use topical rub avoid NSAIDs in the setting of his chronic kidney disease.  Imaging needed at this time.      Relevant Medications   amLODipine (NORVASC) 10 MG tablet   Other Relevant Orders   BASIC METABOLIC PANEL WITH GFR   CBC with Differential/Platelet   Obesity (BMI 30-39.9) - Primary    Other Visit Diagnoses    Neck pain on right side          Note: This dictation was prepared with Dragon dictation along with smaller phrase technology. Any transcriptional errors that result from this process are unintentional.

## 2019-10-30 NOTE — Assessment & Plan Note (Signed)
Uncontrolled blood pressure.  Increase amlodipine to 10 mg once a day.  Goal is blood pressure less than 140/90.  He is going to check his blood pressure at home we will call him Monday with his lab results and he can give [place] the readings.  Neck step is adding metoprolol 12.5 mg once a day.  We will check his renal function as well.  The shoulder neck discomfort I think this is just musculoskeletal inflammation.  No red flags on exam.  He was given Zanaflex to use for spasm he can use topical rub avoid NSAIDs in the setting of his chronic kidney disease.  Imaging needed at this time.

## 2019-10-30 NOTE — Patient Instructions (Addendum)
Norvasc changed to 10mg   Once a day  Avoid fried foods, salty foods  Use the muscle relaxer  Check your blood pressure at goal if > 140/90, come back in  F/U 3 months for Physical

## 2019-10-31 LAB — CBC WITH DIFFERENTIAL/PLATELET
Absolute Monocytes: 572 cells/uL (ref 200–950)
Basophils Absolute: 30 cells/uL (ref 0–200)
Basophils Relative: 0.5 %
Eosinophils Absolute: 242 cells/uL (ref 15–500)
Eosinophils Relative: 4.1 %
HCT: 45.9 % (ref 38.5–50.0)
Hemoglobin: 15.4 g/dL (ref 13.2–17.1)
Lymphs Abs: 1263 cells/uL (ref 850–3900)
MCH: 27.5 pg (ref 27.0–33.0)
MCHC: 33.6 g/dL (ref 32.0–36.0)
MCV: 82 fL (ref 80.0–100.0)
MPV: 9.9 fL (ref 7.5–12.5)
Monocytes Relative: 9.7 %
Neutro Abs: 3794 cells/uL (ref 1500–7800)
Neutrophils Relative %: 64.3 %
Platelets: 195 10*3/uL (ref 140–400)
RBC: 5.6 10*6/uL (ref 4.20–5.80)
RDW: 12.9 % (ref 11.0–15.0)
Total Lymphocyte: 21.4 %
WBC: 5.9 10*3/uL (ref 3.8–10.8)

## 2019-10-31 LAB — BASIC METABOLIC PANEL WITH GFR
BUN: 15 mg/dL (ref 7–25)
CO2: 26 mmol/L (ref 20–32)
Calcium: 8.9 mg/dL (ref 8.6–10.3)
Chloride: 107 mmol/L (ref 98–110)
Creat: 1.25 mg/dL (ref 0.70–1.25)
GFR, Est African American: 72 mL/min/{1.73_m2} (ref >=60)
GFR, Est Non African American: 62 mL/min/{1.73_m2} (ref >=60)
Glucose, Bld: 81 mg/dL (ref 65–99)
Potassium: 4 mmol/L (ref 3.5–5.3)
Sodium: 143 mmol/L (ref 135–146)

## 2019-11-05 ENCOUNTER — Telehealth: Payer: Self-pay | Admitting: *Deleted

## 2019-11-05 ENCOUNTER — Other Ambulatory Visit: Payer: Self-pay

## 2019-11-05 ENCOUNTER — Ambulatory Visit: Payer: BC Managed Care – PPO | Admitting: *Deleted

## 2019-11-05 DIAGNOSIS — I10 Essential (primary) hypertension: Secondary | ICD-10-CM

## 2019-11-05 NOTE — Telephone Encounter (Signed)
Patient noted in office to have BP checked.  Reports that his BP readings at home are as follows: 22-Feb 128/103  23-Feb 136/98  24-Feb 140/94  25-Feb 133/92  25-Feb 129/94   States that he has been taking Amlodipine 10mg  PO QD. Reports that on 11/04/2019, he took (2) tabs of Amlodipine 10mg  because his systolic BP noted 0000000.  BP in office noted as follows: 25-Feb R arm 130/88  25-Feb L arm 134/96   Patient also requested refill on Zanaflex. States that he had to pay out of pocket, so he was only able to obtain a partial fill. Due to [facility] pharmacy laws, partial fills on muscle relaxers void the remaining tabs. Ok to refill?

## 2019-11-05 NOTE — Progress Notes (Signed)
Patient seen in office for BP check.   Noted L arm reading 134/96. R arm reading noted at 130/88.

## 2019-11-06 MED ORDER — TIZANIDINE HCL 4 MG PO TABS: 4.0000 mg | ORAL_TABLET | Freq: Two times a day (BID) | ORAL | 0 refills | Status: DC | PRN

## 2019-11-06 MED ORDER — METOPROLOL SUCCINATE ER 25 MG PO TB24: 12.5000 mg | ORAL_TABLET | Freq: Every day | ORAL | 3 refills | Status: DC

## 2019-11-06 NOTE — Telephone Encounter (Signed)
Prescription sent to pharmacy.   Call placed to patient. LMTRC.  

## 2019-11-06 NOTE — Telephone Encounter (Signed)
Continue norvasc 10mg  once a day ( this is max dose)  Add metoprolol 25mg  XL  ( take  1/2 tablet once a day)  Send BP readings on Monday

## 2019-11-06 NOTE — Telephone Encounter (Signed)
Call placed to patient and patient made aware. Verbalized understanding.  

## 2019-11-13 ENCOUNTER — Telehealth: Payer: Self-pay | Admitting: *Deleted

## 2019-11-13 MED ORDER — METOPROLOL SUCCINATE ER 25 MG PO TB24: 25.0000 mg | ORAL_TABLET | Freq: Every day | ORAL | 3 refills | Status: DC

## 2019-11-13 NOTE — Telephone Encounter (Signed)
Received call from patient.   Reports that BP readings are as follows: 1-Mar 130/98  1-Mar 140/99  2-Mar 138/89  3-Mar 134/88  4-Mar ----------  5-Mar 144/104  5-Mar 139/107   Reports that he is taking Amlodipine 10mg  and Metoprolol 12.5mg  QD.   MD please advise.

## 2019-11-13 NOTE — Telephone Encounter (Signed)
Call placed to patient and patient wife made aware.

## 2019-11-13 NOTE — Telephone Encounter (Signed)
Increase metoprolol to 25mg  once a day Continue norvasc 10mg  F/U in Office next week on Tuesday/Wed

## 2019-11-17 ENCOUNTER — Ambulatory Visit: Payer: BC Managed Care – PPO | Admitting: Family Medicine

## 2019-11-18 ENCOUNTER — Encounter: Payer: Self-pay | Admitting: Family Medicine

## 2019-11-18 ENCOUNTER — Other Ambulatory Visit: Payer: Self-pay

## 2019-11-18 ENCOUNTER — Ambulatory Visit (INDEPENDENT_AMBULATORY_CARE_PROVIDER_SITE_OTHER): Payer: BC Managed Care – PPO | Admitting: Family Medicine

## 2019-11-18 VITALS — BP 144/92 | HR 80 | Temp 98.2°F | Resp 16 | Ht 64.0 in | Wt 184.0 lb

## 2019-11-18 DIAGNOSIS — I10 Essential (primary) hypertension: Secondary | ICD-10-CM | POA: Diagnosis not present

## 2019-11-18 MED ORDER — METOPROLOL SUCCINATE ER 50 MG PO TB24: 50.0000 mg | ORAL_TABLET | Freq: Every day | ORAL | 1 refills | Status: DC

## 2019-11-18 NOTE — Progress Notes (Signed)
   Subjective:    Patient ID: Robert Novak, male    DOB: 12-25-1955, 64 y.o.   MRN: [id number]  Patient presents for Follow-up (BP)   Patient here to follow-up hypertension.  We have been corresponding by phone over the past few weeks.  His blood pressure continued to go up therefore I added metoprolol he is now on 25 mg once a day.  In addition to his amlodipine 10 mg once a day.  His blood pressure recently has been His renal function improved coming off of HCTZ and lisinopril.  His creatinine was at 1.25 with a BUN of 15 2 weeks ago His blood pressure at home is still been elevated 140s to 150s over upper 90s.  His wrist cuff today in the office was about 10 points off from what we manually got.  He denies any chest pain shortness of breath dizziness leg swelling  Review Of Systems:  GEN- denies fatigue, fever, weight loss,weakness, recent illness HEENT- denies eye drainage, change in vision, nasal discharge, CVS- denies chest pain, palpitations RESP- denies SOB, cough, wheeze Neuro- denies headache, dizziness, syncope, seizure activity       Objective:    BP (!) 144/92 (BP Location: Right Arm, Patient Position: Sitting, Cuff Size: Normal)   Pulse 80   Temp 98.2 F (36.8 C) (Temporal)   Resp 16   Ht 5\' 4"  (1.626 m)   Wt 184 lb (83.5 kg)   SpO2 97%   BMI 31.58 kg/m  GEN- NAD, alert and oriented x3 , repeat BP 142/100 HEENT- PERRL, EOMI, non injected sclera,  CVS- RRR, no murmur RESP-CTAB         Assessment & Plan:      Problem List Items Addressed This Visit      Unprioritized   Hypertension - Primary    UnControlled blood pressure.  We will increase his metoprolol to 50 mg once a day continue amlodipine 10 mg.  I want to avoid ACE inhibitor secondary to the renal insufficiency that he had before.  I may attempt adding HCTZ 12.5 mg we do not get any significant response with the higher dose of the beta-blocker.  He is going to call us on Monday with his readings.   We discussed getting a different blood pressure cuff as well.      Relevant Medications   metoprolol succinate (TOPROL-XL) 50 MG 24 hr tablet      Note: This dictation was prepared with Dragon dictation along with smaller phrase technology. Any transcriptional errors that result from this process are unintentional.

## 2019-11-18 NOTE — Patient Instructions (Addendum)
Call me Monday with blood pressure readings Take 50mg  of the Toprol  - ( You can take 2 of the  25mg  tablets until you run out) Norvasc 10mg  once a day  F/U as previous

## 2019-11-18 NOTE — Assessment & Plan Note (Addendum)
UnControlled blood pressure.  We will increase his metoprolol to 50 mg once a day continue amlodipine 10 mg.  I want to avoid ACE inhibitor secondary to the renal insufficiency that he had before.  I may attempt adding HCTZ 12.5 mg we do not get any significant response with the higher dose of the beta-blocker.  He is going to call us on Monday with his readings.  We discussed getting a different blood pressure cuff as well.

## 2019-11-24 ENCOUNTER — Telehealth: Payer: Self-pay | Admitting: *Deleted

## 2019-11-24 NOTE — Telephone Encounter (Signed)
His last few readings look good Continue current meds F/U in office in 3 months if not scheduled

## 2019-11-24 NOTE — Telephone Encounter (Signed)
Call placed to patient and patient made aware per VM.  Patient has appointment scheduled for may for CPE.

## 2019-11-24 NOTE — Telephone Encounter (Signed)
Received call from patient.   Received call from patient.   Reports that BP readings are as follows: 12-Mar 136/ 100  13-Mar 128/ 86  14-Mar 134/ 87  15-Mar 132/ 91  16-Mar 123/ 85   Patient is taking Amlodipine 10mg  PO QD and Metoprolol 50mg  PO QD.   Of note, patient states that he did have DOT PE today and passed.

## 2020-01-15 ENCOUNTER — Encounter: Payer: BC Managed Care – PPO | Admitting: Family Medicine

## 2020-02-23 ENCOUNTER — Other Ambulatory Visit: Payer: Self-pay | Admitting: Family Medicine

## 2020-03-24 ENCOUNTER — Other Ambulatory Visit: Payer: Self-pay | Admitting: *Deleted

## 2020-03-24 DIAGNOSIS — N182 Chronic kidney disease, stage 2 (mild): Secondary | ICD-10-CM

## 2020-03-24 DIAGNOSIS — I10 Essential (primary) hypertension: Secondary | ICD-10-CM

## 2020-03-24 DIAGNOSIS — E669 Obesity, unspecified: Secondary | ICD-10-CM

## 2020-03-24 DIAGNOSIS — E785 Hyperlipidemia, unspecified: Secondary | ICD-10-CM

## 2020-03-25 ENCOUNTER — Other Ambulatory Visit: Payer: Self-pay

## 2020-03-25 ENCOUNTER — Other Ambulatory Visit: Payer: BC Managed Care – PPO

## 2020-03-25 ENCOUNTER — Ambulatory Visit (INDEPENDENT_AMBULATORY_CARE_PROVIDER_SITE_OTHER): Payer: BC Managed Care – PPO | Admitting: Family Medicine

## 2020-03-25 ENCOUNTER — Encounter: Payer: Self-pay | Admitting: Family Medicine

## 2020-03-25 VITALS — BP 142/80 | HR 82 | Temp 97.9°F | Resp 14 | Ht 64.0 in | Wt 181.0 lb

## 2020-03-25 DIAGNOSIS — L309 Dermatitis, unspecified: Secondary | ICD-10-CM

## 2020-03-25 DIAGNOSIS — N529 Male erectile dysfunction, unspecified: Secondary | ICD-10-CM | POA: Diagnosis not present

## 2020-03-25 DIAGNOSIS — E669 Obesity, unspecified: Secondary | ICD-10-CM

## 2020-03-25 DIAGNOSIS — Z1159 Encounter for screening for other viral diseases: Secondary | ICD-10-CM | POA: Diagnosis not present

## 2020-03-25 DIAGNOSIS — I10 Essential (primary) hypertension: Secondary | ICD-10-CM

## 2020-03-25 DIAGNOSIS — Z1211 Encounter for screening for malignant neoplasm of colon: Secondary | ICD-10-CM

## 2020-03-25 DIAGNOSIS — Z0001 Encounter for general adult medical examination with abnormal findings: Secondary | ICD-10-CM

## 2020-03-25 DIAGNOSIS — E785 Hyperlipidemia, unspecified: Secondary | ICD-10-CM

## 2020-03-25 DIAGNOSIS — N182 Chronic kidney disease, stage 2 (mild): Secondary | ICD-10-CM

## 2020-03-25 DIAGNOSIS — Z0000 Encounter for general adult medical examination without abnormal findings: Secondary | ICD-10-CM

## 2020-03-25 MED ORDER — SILDENAFIL CITRATE 100 MG PO TABS: 50.0000 mg | ORAL_TABLET | Freq: Every day | ORAL | 1 refills | Status: DC | PRN

## 2020-03-25 MED ORDER — TRIAMCINOLONE ACETONIDE 0.1 % EX CREA
1.0000 | TOPICAL_CREAM | Freq: Two times a day (BID) | CUTANEOUS | 1 refills | Status: DC
Start: 2020-03-25 — End: 2021-05-03

## 2020-03-25 NOTE — Assessment & Plan Note (Signed)
viagra script given

## 2020-03-25 NOTE — Patient Instructions (Addendum)
Referral to GI for colonoscopy F/U 6 months

## 2020-03-25 NOTE — Assessment & Plan Note (Signed)
Bp looks okay today Continue to work on dietary changes, exercise which helps bp

## 2020-03-25 NOTE — Progress Notes (Signed)
   Subjective:    Patient ID: Robert Novak, male    DOB: 24-Dec-1955, 64 y.o.   MRN: [id number]  Patient presents for Annual Exam (has had labs )  Pt here for CPE  Medications and history reviewed   HTN- taking bp meds, norvasc 10mg  and metoprolol 50mg    Hyperlipidemia- taking lipitor 10mg  at bedtime  Class 1 obesity-   ED- taking cilais but it is not helping wants to go back on viagra  PSA UTD in Sept 2020   Due colonoscopy- history of polyps   Discussed Hepatitis C screening  Immunizations- TDAP UTD    He has not exercising /riding his back , trying to change diet   History of shingles rash declines vaccine at this time   He has not seen eye doctor and dentist   He has a rash on middle of back, he had back used topicals and then a heating pad and had rash afterwards, now has some itching, no pain   Review Of Systems:  GEN- denies fatigue, fever, weight loss,weakness, recent illness HEENT- denies eye drainage, change in vision, nasal discharge, CVS- denies chest pain, palpitations RESP- denies SOB, cough, wheeze ABD- denies N/V, change in stools, abd pain GU- denies dysuria, hematuria, dribbling, incontinence MSK- denies joint pain, muscle aches, injury Neuro- denies headache, dizziness, syncope, seizure activity       Objective:    BP (!) 142/80 (BP Location: Right Arm, Patient Position: Sitting, Cuff Size: Normal)   Pulse 82   Temp 97.9 F (36.6 C) (Temporal)   Resp 14   Ht 5\' 4"  (1.626 m)   Wt 181 lb (82.1 kg)   SpO2 97%   BMI 31.07 kg/m  GEN- NAD, alert and oriented x3 HEENT- PERRL, EOMI, non injected sclera, pink conjunctiva, MMM, oropharynx clear Neck- Supple, no thyromegaly CVS- RRR, no murmur RESP-CTAB ABD-NABS,soft,NT,ND Psych- normal affect and mood  Skin-hyperpigmented macular  mild scaley rash center of lower lumbar spine,NT, no blisters, no papules  EXT- No edema Pulses- Radial, DP- 2+   FALL/CAGE/DEPRESSION SCREEN NEG      Assessment & Plan:      Problem List Items Addressed This Visit      Unprioritized   CKD (chronic kidney disease) stage 2, GFR 60-89 ml/min   Erectile dysfunction    viagra script given      Hypertension    Bp looks okay today Continue to work on dietary changes, exercise which helps bp      Relevant Medications   sildenafil (VIAGRA) 100 MG tablet    Other Visit Diagnoses    Routine general medical examination at a health care facility    -  Primary   CPE done, fasting labs obtained, Hep C screen, PSA negative, referral for colonoscopy    Dermatitis       topical triamcinolone given , contact dermatitis    Colon cancer screening       Relevant Orders   Ambulatory referral to Gastroenterology   Need for hepatitis C screening test       Relevant Orders   Hepatitis C antibody      Note: This dictation was prepared with Dragon dictation along with smaller phrase technology. Any transcriptional errors that result from this process are unintentional.

## 2020-03-26 LAB — COMPLETE METABOLIC PANEL WITH GFR
AG Ratio: 1.7 (calc) (ref 1.0–2.5)
ALT: 24 U/L (ref 9–46)
AST: 24 U/L (ref 10–35)
Albumin: 4.3 g/dL (ref 3.6–5.1)
Alkaline phosphatase (APISO): 76 U/L (ref 35–144)
BUN/Creatinine Ratio: 13 (calc) (ref 6–22)
BUN: 16 mg/dL (ref 7–25)
CO2: 22 mmol/L (ref 20–32)
Calcium: 9.1 mg/dL (ref 8.6–10.3)
Chloride: 105 mmol/L (ref 98–110)
Creat: 1.28 mg/dL — ABNORMAL HIGH (ref 0.70–1.25)
GFR, Est African American: 69 mL/min/{1.73_m2} (ref >=60)
GFR, Est Non African American: 60 mL/min/{1.73_m2} (ref >=60)
Globulin: 2.5 g/dL (calc) (ref 1.9–3.7)
Glucose, Bld: 99 mg/dL (ref 65–99)
Potassium: 5 mmol/L (ref 3.5–5.3)
Sodium: 138 mmol/L (ref 135–146)
Total Bilirubin: 0.9 mg/dL (ref 0.2–1.2)
Total Protein: 6.8 g/dL (ref 6.1–8.1)

## 2020-03-26 LAB — CBC WITH DIFFERENTIAL/PLATELET
Absolute Monocytes: 610 cells/uL (ref 200–950)
Basophils Absolute: 20 cells/uL (ref 0–200)
Basophils Relative: 0.4 %
Eosinophils Absolute: 320 cells/uL (ref 15–500)
Eosinophils Relative: 6.4 %
HCT: 50.5 % — ABNORMAL HIGH (ref 38.5–50.0)
Hemoglobin: 16.8 g/dL (ref 13.2–17.1)
Lymphs Abs: 1535 cells/uL (ref 850–3900)
MCH: 27 pg (ref 27.0–33.0)
MCHC: 33.3 g/dL (ref 32.0–36.0)
MCV: 81.1 fL (ref 80.0–100.0)
MPV: 10 fL (ref 7.5–12.5)
Monocytes Relative: 12.2 %
Neutro Abs: 2515 cells/uL (ref 1500–7800)
Neutrophils Relative %: 50.3 %
Platelets: 190 10*3/uL (ref 140–400)
RBC: 6.23 10*6/uL — ABNORMAL HIGH (ref 4.20–5.80)
RDW: 13.9 % (ref 11.0–15.0)
Total Lymphocyte: 30.7 %
WBC: 5 10*3/uL (ref 3.8–10.8)

## 2020-03-26 LAB — LIPID PANEL
Cholesterol: 236 mg/dL — ABNORMAL HIGH (ref <200)
HDL: 43 mg/dL (ref >=40)
LDL Cholesterol (Calc): 172 mg/dL (calc) — ABNORMAL HIGH
Non-HDL Cholesterol (Calc): 193 mg/dL (calc) — ABNORMAL HIGH (ref <130)
Total CHOL/HDL Ratio: 5.5 (calc) — ABNORMAL HIGH (ref <5.0)
Triglycerides: 95 mg/dL (ref <150)

## 2020-03-28 LAB — HEPATITIS C ANTIBODY
Hepatitis C Ab: NONREACTIVE
SIGNAL TO CUT-OFF: 0.02 (ref <1.00)

## 2020-05-14 ENCOUNTER — Other Ambulatory Visit: Payer: Self-pay | Admitting: Family Medicine

## 2020-07-20 ENCOUNTER — Other Ambulatory Visit: Payer: Self-pay | Admitting: Family Medicine

## 2020-09-21 ENCOUNTER — Other Ambulatory Visit: Payer: Self-pay | Admitting: Family Medicine

## 2020-09-21 NOTE — Telephone Encounter (Signed)
Ok to refill 

## 2020-10-25 ENCOUNTER — Other Ambulatory Visit: Payer: Self-pay | Admitting: Family Medicine

## 2020-12-15 ENCOUNTER — Other Ambulatory Visit: Payer: Self-pay | Admitting: Family Medicine

## 2020-12-15 NOTE — Telephone Encounter (Signed)
Ok to refill 

## 2020-12-21 ENCOUNTER — Encounter: Payer: Self-pay | Admitting: Nurse Practitioner

## 2020-12-21 ENCOUNTER — Telehealth (INDEPENDENT_AMBULATORY_CARE_PROVIDER_SITE_OTHER): Payer: BC Managed Care – PPO | Admitting: Nurse Practitioner

## 2020-12-21 ENCOUNTER — Other Ambulatory Visit: Payer: Self-pay

## 2020-12-21 DIAGNOSIS — R059 Cough, unspecified: Secondary | ICD-10-CM | POA: Diagnosis not present

## 2020-12-21 MED ORDER — BENZONATATE 100 MG PO CAPS: 100.0000 mg | ORAL_CAPSULE | Freq: Two times a day (BID) | ORAL | 0 refills | Status: DC | PRN

## 2020-12-21 MED ORDER — CETIRIZINE HCL 10 MG PO TABS: 10.0000 mg | ORAL_TABLET | Freq: Every day | ORAL | 1 refills | Status: AC

## 2020-12-21 MED ORDER — FLUTICASONE PROPIONATE 50 MCG/ACT NA SUSP: 2.0000 | Freq: Every day | NASAL | 6 refills | Status: DC

## 2020-12-21 NOTE — Progress Notes (Signed)
Subjective:    Patient ID: Robert Novak, male    DOB: 1956/07/01, 65 y.o.   MRN: [id number]  HPI: Robert Novak is a 65 y.o. male presenting virtually for cough.  Chief Complaint  Patient presents with  . Cough    Cough began on Monday, some chills on Tuesday. Taking dayquil and using cough drops. 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 Denies fever or body aches. Has appt for 3rd covid vaccine    UPPER RESPIRATORY TRACT INFECTION COVID testing history: not tested COVID vaccination status: Has had 2 vaccines and is scheduled for booster Onset: Sunday Fever: no, no chills Cough: yes - dry mostly, phlegm early this morning Shortness of breath: no Wheezing: no Chest pain: yes, with cough Chest tightness: no Chest congestion: no Nasal congestion: no Runny nose: yes Post nasal drip: no Sneezing: no Sore throat: no Swollen glands: no Sinus pressure: no Headache: no Face pain: no Toothache: no Ear pain: no  Ear pressure: no  Eyes red/itching:no Eye drainage/crusting: no  Nausea: no  Vomiting: no Diarrhea: yes; Monday.  Better now  Change in appetite: no  Loss of taste/smell: no  Rash: no Fatigue: no Sick contacts: no Strep contacts: no  Context: better Recurrent sinusitis: no Treatments attempted: cough drops, Dayquil, Coricidin Relief with OTC medications: yes  No Known Allergies  Outpatient Encounter Medications as of 12/21/2020  Medication Sig  . amLODipine (NORVASC) 10 MG tablet Take 1 tablet (10 mg total) by mouth daily.  Marland Kitchen atorvastatin (LIPITOR) 10 MG tablet Take 1 tablet (10 mg total) by mouth at bedtime.  . benzonatate (TESSALON) 100 MG capsule Take 1 capsule (100 mg total) by mouth 2 (two) times daily as needed for cough. Take first dose at nighttime and monitor for drowsiness.  If this medication makes you drowsy, do not take while driving or operating heavy machinery.  . cetirizine (ZYRTEC) 10 MG tablet Take 1 tablet  (10 mg total) by mouth daily.  . fluticasone (FLONASE) 50 MCG/ACT nasal spray Place 2 sprays into both nostrils daily.  Marland Kitchen GARLIC PO Take by mouth.   . Ginger, Zingiber officinalis, (GINGER PO) Take by mouth.  . metoprolol succinate (TOPROL-XL) 50 MG 24 hr tablet TAKE 1 TABLET BY MOUTH EVERY DAY  . tadalafil (CIALIS) 20 MG tablet Take 10-20mg  1 hour before intercourse  . tiZANidine (ZANAFLEX) 4 MG tablet Take 1 tablet (4 mg total) by mouth 2 (two) times daily as needed for muscle spasms.  Marland Kitchen triamcinolone cream (KENALOG) 0.1 % Apply 1 application topically 2 (two) times daily.  . [DISCONTINUED] sildenafil (VIAGRA) 100 MG tablet TAKE 1/2 TO 1 (ONE-HALF TO ONE) TABLET BY MOUTH ONCE DAILY AS NEEDED FOR  ERECTILE  DYSFUNCTION   No facility-administered encounter medications on file as of 12/21/2020.    Patient Active Problem List   Diagnosis Date Noted  . CKD (chronic kidney disease) stage 2, GFR 60-89 ml/min 10/30/2019  . Personal history of colonic polyps 06/09/2019  . Obesity (BMI 30-39.9) 06/09/2019  . Erectile dysfunction 04/09/2014  . Hypertension   . Hyperlipidemia     Past Medical History:  Diagnosis Date  . Allergy    pollen  . Hyperlipidemia   . Hypertension   . MRSA cellulitis   . Vitamin D deficiency     Relevant past medical, surgical, family and social history reviewed and updated as indicated. Interim medical history since our last visit reviewed.  Review of Systems  Per HPI unless specifically indicated above     Objective:    There were no vitals taken for this visit.  Wt Readings from Last 3 Encounters:  03/25/20 181 lb (82.1 kg)  11/18/19 184 lb (83.5 kg)  10/30/19 185 lb (83.9 kg)    Physical Exam  Physical examination was unable to be performed due to equipment malfunction.     Assessment & Plan:  1. Cough Acute.  Unclear etiology -likely viral versus allergic at this time.  Encouraged patient to obtain Covid test.  Reassured patient that symptoms and  exam findings are most consistent with a viral upper respiratory infection and explained lack of efficacy of antibiotics against viruses.  Discussed expected course and features suggestive of secondary bacterial infection.  Continue supportive care. Increase fluid intake with water or electrolyte solution like pedialyte. Encouraged acetaminophen as needed for fever/pain. Encouraged salt water gargling, chloraseptic spray and throat lozenges. Encouraged OTC guaifenesin. Encouraged saline sinus flushes and/or neti with humidified air.  Follow-up if symptoms persist more than 7-10 days without improvement.  - SARS-COV-2 RNA,(COVID-19) QUAL NAAT - benzonatate (TESSALON) 100 MG capsule; Take 1 capsule (100 mg total) by mouth 2 (two) times daily as needed for cough. Take first dose at nighttime and monitor for drowsiness.  If this medication makes you drowsy, do not take while driving or operating heavy machinery.  Dispense: 30 capsule; Refill: 0 - cetirizine (ZYRTEC) 10 MG tablet; Take 1 tablet (10 mg total) by mouth daily.  Dispense: 90 tablet; Refill: 1 - fluticasone (FLONASE) 50 MCG/ACT nasal spray; Place 2 sprays into both nostrils daily.  Dispense: 16 g; Refill: 6    Follow up plan: Return if symptoms worsen or fail to improve.   This visit was completed via telephone due to the restrictions of the COVID-19 pandemic. All issues as above were discussed and addressed but no physical exam was performed. If it was felt that the patient should be evaluated in the office, they were directed there. The patient verbally consented to this visit. Patient was unable to complete an audio/visual visit due to Technical difficulties.  Patient logged into Mychart visit, however unable to see or hear him.  He could hear and see me.  Visit was then transitioned to a phone visit. . Location of the patient: home . Location of the provider: work . Those involved with this call:  . Provider: Noemi Chapel, DNP,  FNP-C . CMA: Annabelle Harman, CMA . Front Desk/Registration: Santina Evans  . Time spent on call: 15 minutes on the phone discussing health concerns. 30 minutes total spent in review of patient's record and preparation of their chart.  I verified patient identity using two factors (patient name and date of birth). Patient consents verbally to being seen via telemedicine visit today.

## 2020-12-22 LAB — SARS-COV-2 RNA,(COVID-19) QUALITATIVE NAAT: SARS CoV2 RNA: NOT DETECTED

## 2020-12-26 NOTE — Progress Notes (Signed)
Pt come come by today to pick up result letter for work

## 2021-01-01 DIAGNOSIS — I10 Essential (primary) hypertension: Secondary | ICD-10-CM | POA: Diagnosis not present

## 2021-01-01 DIAGNOSIS — J302 Other seasonal allergic rhinitis: Secondary | ICD-10-CM | POA: Diagnosis not present

## 2021-01-10 ENCOUNTER — Other Ambulatory Visit: Payer: Self-pay | Admitting: Family Medicine

## 2021-04-11 ENCOUNTER — Other Ambulatory Visit: Payer: Self-pay | Admitting: Family Medicine

## 2021-04-20 ENCOUNTER — Other Ambulatory Visit: Payer: Self-pay | Admitting: Family Medicine

## 2021-04-24 ENCOUNTER — Other Ambulatory Visit: Payer: Self-pay | Admitting: Family Medicine

## 2021-04-27 ENCOUNTER — Other Ambulatory Visit: Payer: Self-pay | Admitting: Family Medicine

## 2021-05-02 ENCOUNTER — Other Ambulatory Visit: Payer: Self-pay

## 2021-05-02 ENCOUNTER — Observation Stay (HOSPITAL_COMMUNITY)
Admission: EM | Admit: 2021-05-02 | Discharge: 2021-05-04 | Disposition: A | Discharge status: Home / Self Care | Payer: BC Managed Care – PPO | Attending: Emergency Medicine | Admitting: Emergency Medicine

## 2021-05-02 ENCOUNTER — Ambulatory Visit (HOSPITAL_COMMUNITY)
Admission: EM | Admit: 2021-05-02 | Discharge: 2021-05-02 | Disposition: A | Discharge status: Home / Self Care | Payer: BC Managed Care – PPO | Attending: Urgent Care | Admitting: Urgent Care

## 2021-05-02 ENCOUNTER — Encounter (HOSPITAL_COMMUNITY): Payer: Self-pay

## 2021-05-02 DIAGNOSIS — K429 Umbilical hernia without obstruction or gangrene: Secondary | ICD-10-CM | POA: Diagnosis present

## 2021-05-02 DIAGNOSIS — N182 Chronic kidney disease, stage 2 (mild): Secondary | ICD-10-CM | POA: Insufficient documentation

## 2021-05-02 DIAGNOSIS — Z20822 Contact with and (suspected) exposure to covid-19: Secondary | ICD-10-CM | POA: Insufficient documentation

## 2021-05-02 DIAGNOSIS — I129 Hypertensive chronic kidney disease with stage 1 through stage 4 chronic kidney disease, or unspecified chronic kidney disease: Secondary | ICD-10-CM | POA: Diagnosis not present

## 2021-05-02 DIAGNOSIS — K56609 Unspecified intestinal obstruction, unspecified as to partial versus complete obstruction: Secondary | ICD-10-CM | POA: Diagnosis not present

## 2021-05-02 DIAGNOSIS — I10 Essential (primary) hypertension: Secondary | ICD-10-CM

## 2021-05-02 DIAGNOSIS — K420 Umbilical hernia with obstruction, without gangrene: Secondary | ICD-10-CM | POA: Diagnosis not present

## 2021-05-02 DIAGNOSIS — R109 Unspecified abdominal pain: Secondary | ICD-10-CM

## 2021-05-02 DIAGNOSIS — Z79899 Other long term (current) drug therapy: Secondary | ICD-10-CM | POA: Diagnosis not present

## 2021-05-02 MED ORDER — HYDROCODONE-ACETAMINOPHEN 5-325 MG PO TABS
1.0000 | ORAL_TABLET | Freq: Once | ORAL | Status: AC
  Administered 2021-05-02: 1 via ORAL

## 2021-05-02 MED ORDER — HYDROCODONE-ACETAMINOPHEN 5-325 MG PO TABS
ORAL_TABLET | ORAL | Status: AC
  Filled 2021-05-02: qty 1

## 2021-05-02 NOTE — ED Provider Notes (Signed)
[place]   MRN: [id number] DOB: [birth year]  Subjective:   Robert Novak is a 65 y.o. male presenting for acute onset today of moderate to severe belly pain over his bellybutton.  Has had an umbilical hernia and has always been able to press it back in but has not been able to today denies fever, chest pain, shortness of breath, vomiting, bloody stools.  Has not taken anything for his pain.  No current facility-administered medications for this encounter.  Current Outpatient Medications:    amLODipine (NORVASC) 10 MG tablet, Take 1 tablet (10 mg total) by mouth daily., Disp: 90 tablet, Rfl: 3   atorvastatin (LIPITOR) 10 MG tablet, Take 1 tablet (10 mg total) by mouth at bedtime., Disp: 90 tablet, Rfl: 0   benzonatate (TESSALON) 100 MG capsule, Take 1 capsule (100 mg total) by mouth 2 (two) times daily as needed for cough. Take first dose at nighttime and monitor for drowsiness.  If this medication makes you drowsy, do not take while driving or operating heavy machinery., Disp: 30 capsule, Rfl: 0   cetirizine (ZYRTEC) 10 MG tablet, Take 1 tablet (10 mg total) by mouth daily., Disp: 90 tablet, Rfl: 1   fluticasone (FLONASE) 50 MCG/ACT nasal spray, Place 2 sprays into both nostrils daily., Disp: 16 g, Rfl: 6   GARLIC PO, Take by mouth. , Disp: , Rfl:    Ginger, Zingiber officinalis, (GINGER PO), Take by mouth., Disp: , Rfl:    metoprolol succinate (TOPROL-XL) 50 MG 24 hr tablet, TAKE 1 TABLET BY MOUTH EVERY DAY, Disp: 90 tablet, Rfl: 0   tadalafil (CIALIS) 20 MG tablet, Take 10-'20mg'$  1 hour before intercourse, Disp: 30 tablet, Rfl: 1   tiZANidine (ZANAFLEX) 4 MG tablet, Take 1 tablet (4 mg total) by mouth 2 (two) times daily as needed for muscle spasms., Disp: 20 tablet, Rfl: 0   triamcinolone cream (KENALOG) 0.1 %, Apply 1 application topically 2 (two) times daily., Disp: 30 g, Rfl: 1   No Known Allergies  Past Medical History:  Diagnosis Date   Allergy    pollen    Hyperlipidemia    Hypertension    MRSA cellulitis    Vitamin D deficiency      History reviewed. No pertinent surgical history.  History reviewed. No pertinent family history.  Social History   Tobacco Use   Smoking status: Never   Smokeless tobacco: Never  Vaping Use   Vaping Use: Never used  Substance Use Topics   Alcohol use: No   Drug use: No    ROS   Objective:   Vitals: BP (!) 179/110 (BP Location: Left Arm)   Pulse 64   Temp 98.3 F (36.8 C) (Oral)   Resp 17   SpO2 99%   Physical Exam Constitutional:      General: He is not in acute distress.    Appearance: Normal appearance. He is well-developed. He is not ill-appearing, toxic-appearing or diaphoretic.  HENT:     Head: Normocephalic and atraumatic.     Right Ear: External ear normal.     Left Ear: External ear normal.     Nose: Nose normal.     Mouth/Throat:     Mouth: Mucous membranes are moist.     Pharynx: Oropharynx is clear.  Eyes:     General: No scleral icterus.    Extraocular Movements: Extraocular movements intact.     Pupils: Pupils are equal, round, and reactive to light.  Cardiovascular:  Rate and Rhythm: Normal rate and regular rhythm.     Heart sounds: Normal heart sounds. No murmur heard.   No friction rub. No gallop.  Pulmonary:     Effort: Pulmonary effort is normal. No respiratory distress.     Breath sounds: Normal breath sounds. No stridor. No wheezing, rhonchi or rales.  Abdominal:     General: Bowel sounds are normal. There is no distension.     Palpations: Abdomen is soft. There is no mass.     Tenderness: There is abdominal tenderness. There is no guarding or rebound.     Hernia: A hernia (Nonreducible hernia with exquisite tenderness at the umbilicus) is present.  Skin:    General: Skin is warm and dry.  Neurological:     Mental Status: He is alert and oriented to person, place, and time.  Psychiatric:        Mood and Affect: Mood normal.        Behavior:  Behavior normal.        Thought Content: Thought content normal.      Assessment and Plan :   PDMP not reviewed this encounter.  1. Umbilical hernia without obstruction and without gangrene   2. Acute abdominal pain   3. Essential hypertension     Concern is for an incarcerated hernia.  Patient is going to need imaging and further evaluation than we can provide in the urgent care setting.  Hydrocodone given for his severe pain.  Redirected to the emergency room.   Jaynee Eagles, [place] 05/03/21 [phone]

## 2021-05-02 NOTE — ED Triage Notes (Signed)
Pt c/o belly button pain. Pt states it has swollen for a while but started hurting today.

## 2021-05-02 NOTE — ED Triage Notes (Signed)
Pt in with c/o belly button swelling that started today  States he only gets relief from laying on his back and from when he had a bm

## 2021-05-02 NOTE — Discharge Instructions (Addendum)
Please report to the emergency room as I am concerned that you have an incarcerated umbilical hernia. We have given you hydrocodone for severe pain. Please report to the hospital now for further imaging and evaluation.

## 2021-05-02 NOTE — ED Triage Notes (Signed)
Patient is being discharged from the Urgent Care and sent to the Emergency Department via POV . Per Jaynee Eagles PA, patient is in need of higher level of care due to incarcerated umbilical hernia. Patient is aware and verbalizes understanding of plan of care.  Vitals:   05/02/21 1957  BP: (!) 179/110  Pulse: 64  Resp: 17  Temp: 98.3 F (36.8 C)  SpO2: 99%

## 2021-05-02 NOTE — ED Provider Notes (Signed)
Emergency Medicine Provider Triage Evaluation Note  Robert Novak , a 65 y.o. male  was evaluated in triage.  Pt complains of abdominal pain.  The patient has a known umbilical hernia that he has been unable to reduce for more than a year.  Today, he developed pain to the umbilicus.  No fever, chills, difficulty urinating, vomiting.   Review of Systems  Positive: Abdominal pain Negative: Vomiting, difficulty urinating, fever, chills, diarrhea, rash, chest pain, shortness of breath  Physical Exam  BP (!) 168/108 (BP Location: Right Arm)   Pulse 69   Temp 98 F (36.7 C) (Oral)   Resp 16   Ht '5\' 4"'$  (1.626 m)   Wt 83.5 kg   SpO2 99%   BMI 31.58 kg/m  Gen:   Awake, no distress   Resp:  Normal effort  MSK:   Moves extremities without difficulty  Other:  Abdomen is moderately distended, but soft.  There is an umbilical hernia with overlying redness that is unable to be reduced.  Tender to palpation to the periumbilical region.  Medical Decision Making  Medically screening exam initiated at 10:49 PM.  Appropriate orders placed.  Wenceslao Gromek was informed that the remainder of the evaluation will be completed by another provider, this initial triage assessment does not replace that evaluation, and the importance of remaining in the ED until their evaluation is complete.  Labs and imaging have been ordered.  He will require further work-up and evaluation in the emergency department.   Joline Maxcy A, PA-C 05/02/21 2251    Fredia Sorrow, MD 05/03/21 2015

## 2021-05-03 ENCOUNTER — Encounter (HOSPITAL_COMMUNITY)
Admission: EM | Disposition: A | Discharge status: Home / Self Care | Payer: Self-pay | Source: Home / Self Care | Attending: Emergency Medicine

## 2021-05-03 ENCOUNTER — Observation Stay (HOSPITAL_COMMUNITY): Payer: BC Managed Care – PPO | Admitting: Certified Registered"

## 2021-05-03 ENCOUNTER — Emergency Department (HOSPITAL_COMMUNITY): Payer: BC Managed Care – PPO

## 2021-05-03 ENCOUNTER — Encounter (HOSPITAL_COMMUNITY): Payer: Self-pay

## 2021-05-03 DIAGNOSIS — R109 Unspecified abdominal pain: Secondary | ICD-10-CM | POA: Diagnosis not present

## 2021-05-03 DIAGNOSIS — K420 Umbilical hernia with obstruction, without gangrene: Secondary | ICD-10-CM | POA: Diagnosis present

## 2021-05-03 DIAGNOSIS — K429 Umbilical hernia without obstruction or gangrene: Secondary | ICD-10-CM | POA: Diagnosis not present

## 2021-05-03 DIAGNOSIS — K56609 Unspecified intestinal obstruction, unspecified as to partial versus complete obstruction: Secondary | ICD-10-CM | POA: Diagnosis not present

## 2021-05-03 DIAGNOSIS — Z20822 Contact with and (suspected) exposure to covid-19: Secondary | ICD-10-CM | POA: Diagnosis not present

## 2021-05-03 DIAGNOSIS — N182 Chronic kidney disease, stage 2 (mild): Secondary | ICD-10-CM | POA: Diagnosis not present

## 2021-05-03 DIAGNOSIS — Z79899 Other long term (current) drug therapy: Secondary | ICD-10-CM | POA: Diagnosis not present

## 2021-05-03 DIAGNOSIS — I129 Hypertensive chronic kidney disease with stage 1 through stage 4 chronic kidney disease, or unspecified chronic kidney disease: Secondary | ICD-10-CM | POA: Diagnosis not present

## 2021-05-03 DIAGNOSIS — E559 Vitamin D deficiency, unspecified: Secondary | ICD-10-CM | POA: Diagnosis not present

## 2021-05-03 HISTORY — PX: UMBILICAL HERNIA REPAIR: SHX196

## 2021-05-03 LAB — RESP PANEL BY RT-PCR (FLU A&B, COVID) ARPGX2
Influenza A by PCR: NEGATIVE
Influenza B by PCR: NEGATIVE
SARS Coronavirus 2 by RT PCR: NEGATIVE

## 2021-05-03 LAB — CBC WITH DIFFERENTIAL/PLATELET
Abs Immature Granulocytes: 0.03 10*3/uL (ref 0.00–0.07)
Basophils Absolute: 0 10*3/uL (ref 0.0–0.1)
Basophils Relative: 0 %
Eosinophils Absolute: 0.1 10*3/uL (ref 0.0–0.5)
Eosinophils Relative: 1 %
HCT: 47 % (ref 39.0–52.0)
Hemoglobin: 15.7 g/dL (ref 13.0–17.0)
Immature Granulocytes: 0 %
Lymphocytes Relative: 12 %
Lymphs Abs: 1 10*3/uL (ref 0.7–4.0)
MCH: 26.5 pg (ref 26.0–34.0)
MCHC: 33.4 g/dL (ref 30.0–36.0)
MCV: 79.3 fL — ABNORMAL LOW (ref 80.0–100.0)
Monocytes Absolute: 0.6 10*3/uL (ref 0.1–1.0)
Monocytes Relative: 7 %
Neutro Abs: 6.7 10*3/uL (ref 1.7–7.7)
Neutrophils Relative %: 80 %
Platelets: 188 10*3/uL (ref 150–400)
RBC: 5.93 MIL/uL — ABNORMAL HIGH (ref 4.22–5.81)
RDW: 13.3 % (ref 11.5–15.5)
WBC: 8.5 10*3/uL (ref 4.0–10.5)
nRBC: 0 % (ref 0.0–0.2)

## 2021-05-03 LAB — COMPREHENSIVE METABOLIC PANEL
ALT: 31 U/L (ref 0–44)
AST: 30 U/L (ref 15–41)
Albumin: 4 g/dL (ref 3.5–5.0)
Alkaline Phosphatase: 67 U/L (ref 38–126)
Anion gap: 8 (ref 5–15)
BUN: 14 mg/dL (ref 8–23)
CO2: 24 mmol/L (ref 22–32)
Calcium: 9.2 mg/dL (ref 8.9–10.3)
Chloride: 104 mmol/L (ref 98–111)
Creatinine, Ser: 1.13 mg/dL (ref 0.61–1.24)
GFR, Estimated: >60 mL/min (ref >60)
Glucose, Bld: 92 mg/dL (ref 70–99)
Potassium: 4.2 mmol/L (ref 3.5–5.1)
Sodium: 136 mmol/L (ref 135–145)
Total Bilirubin: 0.9 mg/dL (ref 0.3–1.2)
Total Protein: 6.9 g/dL (ref 6.5–8.1)

## 2021-05-03 LAB — HIV ANTIBODY (ROUTINE TESTING W REFLEX): HIV Screen 4th Generation wRfx: NONREACTIVE

## 2021-05-03 LAB — ABO/RH: ABO/RH(D): B POS

## 2021-05-03 LAB — TYPE AND SCREEN
ABO/RH(D): B POS
Antibody Screen: NEGATIVE

## 2021-05-03 SURGERY — REPAIR, HERNIA, UMBILICAL, ADULT
Anesthesia: General | Site: Abdomen

## 2021-05-03 MED ORDER — MORPHINE SULFATE (PF) 2 MG/ML IV SOLN: 2.0000 mg | INTRAVENOUS | Status: DC | PRN

## 2021-05-03 MED ORDER — MORPHINE SULFATE (PF) 4 MG/ML IV SOLN
4.0000 mg | Freq: Once | INTRAVENOUS | Status: AC
  Administered 2021-05-03: 4 mg via INTRAVENOUS
  Filled 2021-05-03: qty 1

## 2021-05-03 MED ORDER — ONDANSETRON 4 MG PO TBDP: 4.0000 mg | ORAL_TABLET | Freq: Four times a day (QID) | ORAL | Status: DC | PRN

## 2021-05-03 MED ORDER — METHOCARBAMOL 500 MG PO TABS: 500.0000 mg | ORAL_TABLET | Freq: Four times a day (QID) | ORAL | Status: DC | PRN

## 2021-05-03 MED ORDER — ENOXAPARIN SODIUM 40 MG/0.4ML IJ SOSY
40.0000 mg | PREFILLED_SYRINGE | INTRAMUSCULAR | Status: DC
  Administered 2021-05-04: 40 mg via SUBCUTANEOUS
  Filled 2021-05-03: qty 0.4

## 2021-05-03 MED ORDER — DIPHENHYDRAMINE HCL 12.5 MG/5ML PO ELIX: 12.5000 mg | ORAL_SOLUTION | Freq: Four times a day (QID) | ORAL | Status: DC | PRN

## 2021-05-03 MED ORDER — FENTANYL CITRATE (PF) 250 MCG/5ML IJ SOLN
INTRAMUSCULAR | Status: AC
  Filled 2021-05-03: qty 5

## 2021-05-03 MED ORDER — HYDRALAZINE HCL 20 MG/ML IJ SOLN
10.0000 mg | INTRAMUSCULAR | Status: DC | PRN
Start: 2021-05-03 — End: 2021-05-04
  Administered 2021-05-03: 10 mg via INTRAVENOUS
  Filled 2021-05-03: qty 1

## 2021-05-03 MED ORDER — ONDANSETRON HCL 4 MG/2ML IJ SOLN: 4.0000 mg | Freq: Four times a day (QID) | INTRAMUSCULAR | Status: DC | PRN

## 2021-05-03 MED ORDER — IOHEXOL 350 MG/ML SOLN
75.0000 mL | Freq: Once | INTRAVENOUS | Status: AC | PRN
  Administered 2021-05-03: 75 mL via INTRAVENOUS

## 2021-05-03 MED ORDER — ACETAMINOPHEN 650 MG RE SUPP: 650.0000 mg | Freq: Four times a day (QID) | RECTAL | Status: DC | PRN

## 2021-05-03 MED ORDER — 0.9 % SODIUM CHLORIDE (POUR BTL) OPTIME
TOPICAL | Status: DC | PRN
  Administered 2021-05-03: 1000 mL

## 2021-05-03 MED ORDER — OXYCODONE HCL 5 MG PO TABS
5.0000 mg | ORAL_TABLET | ORAL | Status: DC | PRN
  Administered 2021-05-03: 5 mg via ORAL
  Filled 2021-05-03: qty 1

## 2021-05-03 MED ORDER — OXYCODONE HCL 5 MG/5ML PO SOLN: 5.0000 mg | Freq: Once | ORAL | Status: DC | PRN

## 2021-05-03 MED ORDER — HYDRALAZINE HCL 20 MG/ML IJ SOLN
5.0000 mg | Freq: Once | INTRAMUSCULAR | Status: AC
  Administered 2021-05-03: 5 mg via INTRAVENOUS

## 2021-05-03 MED ORDER — SODIUM CHLORIDE 0.9 % IV SOLN
2.0000 g | INTRAVENOUS | Status: AC
  Administered 2021-05-03: 2 g via INTRAVENOUS
  Filled 2021-05-03 (×2): qty 2

## 2021-05-03 MED ORDER — DEXTROSE-NACL 5-0.9 % IV SOLN: INTRAVENOUS | Status: DC

## 2021-05-03 MED ORDER — SODIUM CHLORIDE 0.9 % IV BOLUS
1000.0000 mL | Freq: Once | INTRAVENOUS | Status: AC
  Administered 2021-05-03: 1000 mL via INTRAVENOUS

## 2021-05-03 MED ORDER — FENTANYL CITRATE (PF) 100 MCG/2ML IJ SOLN
25.0000 ug | INTRAMUSCULAR | Status: DC | PRN
  Administered 2021-05-03: 25 ug via INTRAVENOUS

## 2021-05-03 MED ORDER — MIDAZOLAM HCL 2 MG/2ML IJ SOLN
INTRAMUSCULAR | Status: DC | PRN
  Administered 2021-05-03: 2 mg via INTRAVENOUS

## 2021-05-03 MED ORDER — ENOXAPARIN SODIUM 40 MG/0.4ML IJ SOSY: 40.0000 mg | PREFILLED_SYRINGE | INTRAMUSCULAR | Status: DC

## 2021-05-03 MED ORDER — OXYCODONE HCL 5 MG PO TABS: 5.0000 mg | ORAL_TABLET | Freq: Once | ORAL | Status: DC | PRN

## 2021-05-03 MED ORDER — CHLORHEXIDINE GLUCONATE 0.12 % MT SOLN
15.0000 mL | OROMUCOSAL | Status: AC
  Filled 2021-05-03: qty 15

## 2021-05-03 MED ORDER — ACETAMINOPHEN 10 MG/ML IV SOLN: 1000.0000 mg | Freq: Once | INTRAVENOUS | Status: DC | PRN

## 2021-05-03 MED ORDER — PROPOFOL 10 MG/ML IV BOLUS
INTRAVENOUS | Status: DC | PRN
  Administered 2021-05-03: 50 mg via INTRAVENOUS
  Administered 2021-05-03: 110 mg via INTRAVENOUS

## 2021-05-03 MED ORDER — FENTANYL CITRATE (PF) 100 MCG/2ML IJ SOLN
INTRAMUSCULAR | Status: AC
  Filled 2021-05-03: qty 2

## 2021-05-03 MED ORDER — LACTATED RINGERS IV SOLN: INTRAVENOUS | Status: DC

## 2021-05-03 MED ORDER — ROCURONIUM BROMIDE 10 MG/ML (PF) SYRINGE
PREFILLED_SYRINGE | INTRAVENOUS | Status: DC | PRN
Start: 2021-05-03 — End: 2021-05-03
  Administered 2021-05-03: 60 mg via INTRAVENOUS

## 2021-05-03 MED ORDER — METOPROLOL TARTRATE 5 MG/5ML IV SOLN: 5.0000 mg | Freq: Four times a day (QID) | INTRAVENOUS | Status: DC | PRN

## 2021-05-03 MED ORDER — MIDAZOLAM HCL 2 MG/2ML IJ SOLN
INTRAMUSCULAR | Status: AC
  Filled 2021-05-03: qty 2

## 2021-05-03 MED ORDER — HYDROMORPHONE HCL 1 MG/ML IJ SOLN: 1.0000 mg | INTRAMUSCULAR | Status: DC | PRN

## 2021-05-03 MED ORDER — SODIUM CHLORIDE 0.9 % IV SOLN: INTRAVENOUS | Status: DC

## 2021-05-03 MED ORDER — OXYCODONE HCL 5 MG PO TABS: 5.0000 mg | ORAL_TABLET | ORAL | Status: DC | PRN

## 2021-05-03 MED ORDER — ONDANSETRON HCL 4 MG/2ML IJ SOLN
4.0000 mg | Freq: Once | INTRAMUSCULAR | Status: AC
  Administered 2021-05-03: 4 mg via INTRAVENOUS
  Filled 2021-05-03: qty 2

## 2021-05-03 MED ORDER — CHLORHEXIDINE GLUCONATE 0.12 % MT SOLN
OROMUCOSAL | Status: AC
  Administered 2021-05-03: 15 mL via OROMUCOSAL
  Filled 2021-05-03: qty 15

## 2021-05-03 MED ORDER — LIDOCAINE 2% (20 MG/ML) 5 ML SYRINGE
INTRAMUSCULAR | Status: DC | PRN
  Administered 2021-05-03: 60 mg via INTRAVENOUS

## 2021-05-03 MED ORDER — ONDANSETRON HCL 4 MG/2ML IJ SOLN
INTRAMUSCULAR | Status: DC | PRN
Start: 2021-05-03 — End: 2021-05-03
  Administered 2021-05-03: 4 mg via INTRAVENOUS

## 2021-05-03 MED ORDER — SUGAMMADEX SODIUM 200 MG/2ML IV SOLN
INTRAVENOUS | Status: DC | PRN
  Administered 2021-05-03: 200 mg via INTRAVENOUS

## 2021-05-03 MED ORDER — ACETAMINOPHEN 160 MG/5ML PO SOLN: 1000.0000 mg | Freq: Once | ORAL | Status: DC | PRN

## 2021-05-03 MED ORDER — ACETAMINOPHEN 325 MG PO TABS: 650.0000 mg | ORAL_TABLET | Freq: Four times a day (QID) | ORAL | Status: DC | PRN

## 2021-05-03 MED ORDER — FENTANYL CITRATE (PF) 250 MCG/5ML IJ SOLN
INTRAMUSCULAR | Status: DC | PRN
  Administered 2021-05-03 (×3): 50 ug via INTRAVENOUS

## 2021-05-03 MED ORDER — AMLODIPINE BESYLATE 10 MG PO TABS
10.0000 mg | ORAL_TABLET | Freq: Every day | ORAL | Status: DC
  Administered 2021-05-04: 10 mg via ORAL
  Filled 2021-05-03: qty 1

## 2021-05-03 MED ORDER — BUPIVACAINE HCL (PF) 0.25 % IJ SOLN
INTRAMUSCULAR | Status: AC
  Filled 2021-05-03: qty 30

## 2021-05-03 MED ORDER — HYDRALAZINE HCL 20 MG/ML IJ SOLN
INTRAMUSCULAR | Status: AC
  Filled 2021-05-03: qty 1

## 2021-05-03 MED ORDER — DEXAMETHASONE SODIUM PHOSPHATE 10 MG/ML IJ SOLN
INTRAMUSCULAR | Status: DC | PRN
  Administered 2021-05-03: 10 mg via INTRAVENOUS

## 2021-05-03 MED ORDER — BUPIVACAINE HCL (PF) 0.25 % IJ SOLN
INTRAMUSCULAR | Status: DC | PRN
Start: 2021-05-03 — End: 2021-05-03
  Administered 2021-05-03: 8 mL

## 2021-05-03 MED ORDER — ACETAMINOPHEN 500 MG PO TABS: 1000.0000 mg | ORAL_TABLET | Freq: Once | ORAL | Status: DC | PRN

## 2021-05-03 MED ORDER — DIPHENHYDRAMINE HCL 50 MG/ML IJ SOLN: 12.5000 mg | Freq: Four times a day (QID) | INTRAMUSCULAR | Status: DC | PRN

## 2021-05-03 SURGICAL SUPPLY — 40 items
ADH SKN CLS APL DERMABOND .7 (GAUZE/BANDAGES/DRESSINGS) ×2
APL PRP STRL LF DISP 70% ISPRP (MISCELLANEOUS) ×2
BALL CTTN LRG ABS STRL LF (GAUZE/BANDAGES/DRESSINGS) ×2
BINDER ABDOMINAL 12 ML 46-62 (SOFTGOODS) ×3 IMPLANT
BLADE CLIPPER SURG (BLADE) ×2 IMPLANT
BLADE SURG 10 STRL SS (BLADE) ×2 IMPLANT
CANISTER SUCT 3000ML PPV (MISCELLANEOUS) ×2 IMPLANT
CHLORAPREP W/TINT 26 (MISCELLANEOUS) ×3 IMPLANT
COTTONBALL LRG STERILE PKG (GAUZE/BANDAGES/DRESSINGS) ×2 IMPLANT
COVER SURGICAL LIGHT HANDLE (MISCELLANEOUS) ×3 IMPLANT
DERMABOND ADVANCED (GAUZE/BANDAGES/DRESSINGS) ×1
DERMABOND ADVANCED .7 DNX12 (GAUZE/BANDAGES/DRESSINGS) ×2 IMPLANT
DRSG TEGADERM 4X4.75 (GAUZE/BANDAGES/DRESSINGS) ×2 IMPLANT
ELECT CAUTERY BLADE 6.4 (BLADE) ×3 IMPLANT
ELECT REM PT RETURN 9FT ADLT (ELECTROSURGICAL) ×3
ELECTRODE REM PT RTRN 9FT ADLT (ELECTROSURGICAL) ×2 IMPLANT
GLOVE SRG 8 PF TXTR STRL LF DI (GLOVE) ×2 IMPLANT
GLOVE SURG ENC MOIS LTX SZ8 (GLOVE) ×3 IMPLANT
GLOVE SURG UNDER POLY LF SZ8 (GLOVE) ×3
GOWN STRL REUS W/ TWL LRG LVL3 (GOWN DISPOSABLE) ×6 IMPLANT
GOWN STRL REUS W/ TWL XL LVL3 (GOWN DISPOSABLE) ×2 IMPLANT
GOWN STRL REUS W/TWL LRG LVL3 (GOWN DISPOSABLE) ×9
GOWN STRL REUS W/TWL XL LVL3 (GOWN DISPOSABLE) ×3
KIT BASIN OR (CUSTOM PROCEDURE TRAY) ×3 IMPLANT
KIT TURNOVER KIT B (KITS) ×3 IMPLANT
MARKER SKIN DUAL TIP RULER LAB (MISCELLANEOUS) ×3 IMPLANT
NDL SPNL 22GX3.5 QUINCKE BK (NEEDLE) ×1 IMPLANT
NEEDLE SPNL 22GX3.5 QUINCKE BK (NEEDLE) ×3 IMPLANT
NS IRRIG 1000ML POUR BTL (IV SOLUTION) ×3 IMPLANT
PAD ARMBOARD 7.5X6 YLW CONV (MISCELLANEOUS) ×3 IMPLANT
PENCIL SMOKE EVACUATOR (MISCELLANEOUS) ×3 IMPLANT
SUT MNCRL AB 4-0 PS2 18 (SUTURE) ×4 IMPLANT
SUT NOVA NAB DX-16 0-1 5-0 T12 (SUTURE) ×3 IMPLANT
SUT VIC AB 3-0 SH 18 (SUTURE) ×3 IMPLANT
TOWEL GREEN STERILE (TOWEL DISPOSABLE) ×3 IMPLANT
TOWEL GREEN STERILE FF (TOWEL DISPOSABLE) ×3 IMPLANT
TRAY LAPAROSCOPIC MC (CUSTOM PROCEDURE TRAY) ×3 IMPLANT
TUBE CONNECTING 12X1/4 (SUCTIONS) IMPLANT
WATER STERILE IRR 1000ML POUR (IV SOLUTION) ×3 IMPLANT
YANKAUER SUCT BULB TIP NO VENT (SUCTIONS) ×2 IMPLANT

## 2021-05-03 NOTE — Op Note (Signed)
Preoperative diagnosis: Incarcerated umbilical hernia  Postoperative diagnosis: Incarcerated local hernia with preperitoneal fat  Procedure: Repair of incarcerated umbilical hernia  Surgeon: Erroll Luna, MD  Anesthesia: General with 0.25% Marcaine plain local  Assistant: Kristine Garbe MD   I was personally present during the key and critical portions of this procedure and immediately available throughout the entire procedure, as documented in my operative note.    EBL: Minimal  Specimen: None  Drains: None  Indications for procedure: The patient presented emergency room this morning with incarcerated umbilical hernia.  He presents for repair since this was nonreducible.  He had signs of obstruction.  Risk, benefits and long-term expectations of surgery discussed.  Possible bowel resection discussed and the possible use of mesh discussed.  Nonoperative options discussed which were none.The risk of hernia repair include bleeding,  Infection,   Recurrence of the hernia,  Mesh use, chronic pain,  Organ injury,  Bowel injury,  Bladder injury,   nerve injury with numbness around the incision,  Death,  and worsening of preexisting  medical problems.  The alternatives to surgery have been discussed as well..  Long term expectations of both operative and non operative treatments have been discussed.   The patient agrees to proceed.     Description of procedure: The patient was met in the holding area and questions answered.  Is taken back to the operative room placed supine upon the OR table.  After induction general esthesia, a timeout was performed and he was prepped and draped in a sterile fashion.  Once timeout was done and proper patient, site and procedure verified, a transverse elliptical incision was made around the inferior base of the umbilicus.  Dissection was carried down and the hernia sac was encountered and dissected free of the underlying skin.  The sac was opened and there is  just preperitoneal fat.  No signs of bowel.  I was able to look through the hernia into the abdominal cavity and there is just omental fat.  No signs of any bowel involvement whatsoever.  This was reduced back in the abdominal cavity.  The defect with a centimeter was closed with figure-of-eight #1 Novafil sutures.  Hemostasis achieved.  We then tacked the skin down to the fascia with interrupted 3-0 Vicryl's.  4 Monocryl was used to close the skin in a subcuticular fashion.  Dermabond was applied.  All counts were found to be correct.  The patient was awoke extubated taken to recovery in satisfactory condition.

## 2021-05-03 NOTE — H&P (Signed)
Robert Novak [birth year]  [id number].    Requesting MD: Dr. Gilford Raid  Chief Complaint/Reason for Consult: umbilical hernia  HPI: Robert Novak is a 65 y.o. male with a hx of HTN, HLD who presented to the ED for umbilical hernia. Patient reports yesterday 6pm he was sitting down to use the restroom when he developed a sharp pain near his umbilicus. Since that time he has noticed a bulge at his umbilicus with constant, moderate to severe pain that is worse with palpation. The skin at the umbilicus has begun to turn red. No associated fever, chills, n/v. He continues to pass flatus. Last BM yeserday morning before his symptoms began. He was unaware that he had an umbilical hernia before this. CT A/P showed incarcerated umbilical hernia with SBO. No prior abdominal surgeries. He is not on any blood thinners. We were asked to see.   ROS: Review of Systems  Constitutional:  Negative for fever.  Respiratory:  Negative for shortness of breath.   Cardiovascular:  Negative for chest pain.  Gastrointestinal:  Positive for abdominal pain. Negative for constipation, nausea and vomiting.  Skin:        Redness at umbilicus   Psychiatric/Behavioral:  Negative for substance abuse.   All other systems reviewed and are negative.  No family history on file.  Past Medical History:  Diagnosis Date   Allergy    pollen   Hyperlipidemia    Hypertension    MRSA cellulitis    Vitamin D deficiency     No past surgical history on file.  Social History:  reports that he has never smoked. He has never used smokeless tobacco. He reports that he does not drink alcohol and does not use drugs. 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 Has 2 other children in their 26's Denies alcohol, tobacco or illicit drug use  Allergies: No Known Allergies  (Not in a hospital admission)    Physical Exam: Blood pressure (!) 188/126, pulse 76,  temperature 98 F (36.7 C), temperature source Oral, resp. rate 18, height '5\' 4"'$  (1.626 m), weight 83.5 kg, SpO2 99 %. General: pleasant, WD/WN male who is laying in bed in NAD HEENT: head is normocephalic, atraumatic.  Sclera are noninjected.  PERRL.  Ears and nose without any masses or lesions.  Mouth is pink and moist. Dentition fair Heart: regular, rate, and rhythm.  Normal s1,s2. No obvious murmurs, gallops, or rubs noted.  Palpable pedal pulses bilaterally  Lungs: CTAB, no wheezes, rhonchi, or rales noted.  Respiratory effort nonlabored Abd: Soft, mild distension. Umbilical hernia that is incarcerated and unable to be reduced. This has overlying skin erythema and is tender to touch. Abdomen is otherwise NT. Slightly hypoactive BS. No masses or organomegaly MS: no BUE/BLE edema, calves soft and nontender Skin: warm and dry with no masses, lesions, or rashes Psych: A&Ox4 with an appropriate affect Neuro: cranial nerves grossly intact, equal strength in BUE/BLE bilaterally, normal speech, thought process intact, moves all extremities, gait not assessed   Results for orders placed or performed during the hospital encounter of 05/02/21 (from the past 48 hour(s))  CBC with Differential     Status: Abnormal   Collection Time: 05/02/21 11:52 PM  Result Value Ref Range   WBC 8.5 4.0 - 10.5 K/uL   RBC 5.93 (H) 4.22 - 5.81 MIL/uL   Hemoglobin 15.7 13.0 - 17.0 g/dL   HCT  47.0 39.0 - 52.0 %   MCV 79.3 (L) 80.0 - 100.0 fL   MCH 26.5 26.0 - 34.0 pg   MCHC 33.4 30.0 - 36.0 g/dL   RDW 13.3 11.5 - 15.5 %   Platelets 188 150 - 400 K/uL   nRBC 0.0 0.0 - 0.2 %   Neutrophils Relative % 80 %   Neutro Abs 6.7 1.7 - 7.7 K/uL   Lymphocytes Relative 12 %   Lymphs Abs 1.0 0.7 - 4.0 K/uL   Monocytes Relative 7 %   Monocytes Absolute 0.6 0.1 - 1.0 K/uL   Eosinophils Relative 1 %   Eosinophils Absolute 0.1 0.0 - 0.5 K/uL   Basophils Relative 0 %   Basophils Absolute 0.0 0.0 - 0.1 K/uL   Immature  Granulocytes 0 %   Abs Immature Granulocytes 0.03 0.00 - 0.07 K/uL    Comment: Performed at [facility] [address] , [place] [address]  Comprehensive metabolic panel     Status: None   Collection Time: 05/02/21 11:52 PM  Result Value Ref Range   Sodium 136 135 - 145 mmol/L   Potassium 4.2 3.5 - 5.1 mmol/L   Chloride 104 98 - 111 mmol/L   CO2 24 22 - 32 mmol/L   Glucose, Bld 92 70 - 99 mg/dL    Comment: Glucose reference range applies only to samples taken after fasting for at least 8 hours.   BUN 14 8 - 23 mg/dL   Creatinine, Ser 1.13 0.61 - 1.24 mg/dL   Calcium 9.2 8.9 - 10.3 mg/dL   Total Protein 6.9 6.5 - 8.1 g/dL   Albumin 4.0 3.5 - 5.0 g/dL   AST 30 15 - 41 U/L   ALT 31 0 - 44 U/L   Alkaline Phosphatase 67 38 - 126 U/L   Total Bilirubin 0.9 0.3 - 1.2 mg/dL   GFR, Estimated >60 >60 mL/min    Comment: (NOTE) Calculated using the CKD-EPI Creatinine Equation (2021)    Anion gap 8 5 - 15    Comment: Performed at [facility] [address], [place], [place] [address]   CT ABDOMEN PELVIS W CONTRAST  Result Date: 05/03/2021 CLINICAL DATA:  Abdominal pain. EXAM: CT ABDOMEN AND PELVIS WITH CONTRAST TECHNIQUE: Multidetector CT imaging of the abdomen and pelvis was performed using the standard protocol following bolus administration of intravenous contrast. CONTRAST:  88m OMNIPAQUE IOHEXOL 350 MG/ML SOLN COMPARISON:  None. FINDINGS: Lower chest: The visualized lung bases are clear. No intra-abdominal free air or free fluid. Hepatobiliary: Probable fatty liver. No intrahepatic biliary dilatation. There is a 15 mm left hepatic cyst. The gallbladder is unremarkable. Pancreas: Unremarkable. No pancreatic ductal dilatation or surrounding inflammatory changes. Spleen: Normal in size without focal abnormality. Adrenals/Urinary Tract: The adrenal glands are unremarkable. The kidneys, visualized ureters, and urinary bladder appear unremarkable. Stomach/Bowel: There is  herniation of a short segment of small bowel through a small umbilical defect. There is pinching of the herniated bowel at the neck of the hernia with associated dilatation of the small bowel proximal to the hernia measuring up to 3.6 cm in caliber. The distal small bowel demonstrate a normal caliber. The appendix is normal. Vascular/Lymphatic: Mild aortoiliac atherosclerotic disease. The IVC is unremarkable. No pain venous gas. There is no adenopathy. Reproductive: The prostate and seminal vesicles are grossly unremarkable. No pelvic mass. Other: None Musculoskeletal: Degenerative changes.  No acute osseous pathology. IMPRESSION: 1. Small-bowel obstruction secondary to an umbilical hernia. Clinical  correlation and surgical consult is advised 2. Aortic Atherosclerosis (ICD10-I70.0). Electronically Signed   By: Anner Crete M.D.   On: 05/03/2021 03:13    Anti-infectives (From admission, onward)    None       Assessment/Plan Incarcerated Umbilical Hernia w/ SBO - Will plan for OR today for open umbilical hernia repair and possible SBR with Dr. Brantley Stage. Discussed with patient anatomy, indication, and risks. Risks include but are not limited to anesthesia (MI, CVA, death), recurrence, bleeding, infection, scarring, pain, injury to surrounding structures, ileus, and possibility of leak if he requires a SBR w/ reanastomosis. We also discussed the typical post-operative course and lifting restrictions after surgery. Patient is in agreement and wishes to proceed with surgery. IV abx on call to OR. Keep NPO. Admit to observation.   FEN - NPO, IVF VTE - SCDs, Lovenox ID - Ancef on call to OR  HTN [place], [facility] 05/03/2021, 9:39 AM Please see Amion for pager number during day hours 7:00am-4:30pm

## 2021-05-03 NOTE — Anesthesia Procedure Notes (Signed)
Procedure Name: Intubation Date/Time: 05/03/2021 5:28 PM Performed by: Dorthea Cove, CRNA Pre-anesthesia Checklist: Patient identified, Emergency Drugs available, Suction available and Patient being monitored Patient Re-evaluated:Patient Re-evaluated prior to induction Oxygen Delivery Method: Circle system utilized Preoxygenation: Pre-oxygenation with 100% oxygen Induction Type: IV induction Ventilation: Mask ventilation without difficulty Laryngoscope Size: Mac and 4 Grade View: Grade II Tube type: Oral Tube size: 7.5 mm Number of attempts: 1 Airway Equipment and Method: Stylet and Oral airway Placement Confirmation: ETT inserted through vocal cords under direct vision, positive ETCO2 and breath sounds checked- equal and bilateral Secured at: 23 cm Tube secured with: Tape Dental Injury: Teeth and Oropharynx as per pre-operative assessment

## 2021-05-03 NOTE — Anesthesia Preprocedure Evaluation (Addendum)
Anesthesia Evaluation  Patient identified by MRN, date of birth, ID band Patient awake    Reviewed: Allergy & Precautions, NPO status , Patient's Chart, lab work & pertinent test results  History of Anesthesia Complications Negative for: history of anesthetic complications  Airway Mallampati: III  TM Distance: >3 FB Neck ROM: Full    Dental  (+) Dental Advisory Given   Pulmonary neg pulmonary ROS,  Covid-19 Nucleic Acid Test Results Lab Results      Component                Value               Date                      [place]              NEGATIVE            05/03/2021                [place]              NOT DETECTED        12/21/2020              breath sounds clear to auscultation       Cardiovascular hypertension, Pt. on medications and Pt. on home beta blockers (-) angina(-) Past MI and (-) CHF  Rhythm:Regular     Neuro/Psych negative neurological ROS  negative psych ROS   GI/Hepatic negative GI ROS, Neg liver ROS,   Endo/Other  negative endocrine ROS  Renal/GU Renal diseaseLab Results      Component                Value               Date                      CREATININE               1.13                05/02/2021           Lab Results      Component                Value               Date                      K                        4.2                 05/02/2021                Musculoskeletal negative musculoskeletal ROS (+)   Abdominal   Peds  Hematology negative hematology ROS (+)   Anesthesia Other Findings   Reproductive/Obstetrics                            Anesthesia Physical Anesthesia Plan  ASA: 2  Anesthesia Plan: General   Post-op Pain Management:    Induction: Intravenous  PONV Risk Score and Plan: 2 and Ondansetron and Dexamethasone  Airway Management Planned: Oral ETT  Additional Equipment: None  Intra-op Plan:   Post-operative Plan:  Extubation in  OR  Informed Consent: I have reviewed the patients History and Physical, chart, labs and discussed the procedure including the risks, benefits and alternatives for the proposed anesthesia with the patient or authorized representative who has indicated his/her understanding and acceptance.     Dental advisory given  Plan Discussed with: CRNA and Anesthesiologist  Anesthesia Plan Comments:         Anesthesia Quick Evaluation

## 2021-05-03 NOTE — ED Notes (Addendum)
Attempted X 1 to call report. Secretary states charge RN has not approved pt yet.

## 2021-05-03 NOTE — ED Notes (Signed)
MD at bedside assessing pt and updating pt on plan of care.  

## 2021-05-03 NOTE — ED Notes (Signed)
Attempted to call report. Per Network engineer, nurse not ready for report.

## 2021-05-03 NOTE — Interval H&P Note (Signed)
History and Physical Interval Note:  05/03/2021 4:41 PM  Robert Novak  has presented today for surgery, with the diagnosis of UMBILICAL HERNIA.  The various methods of treatment have been discussed with the patient and family. After consideration of risks, benefits and other options for treatment, the patient has consented to  Procedure(s): LAPAROSCOPIC UMBILICAL HERNIA OPEN. POSSIBLE SMALL BOWEL RESECTION (N/A) as a surgical intervention.  The patient's history has been reviewed, patient examined, no change in status, stable for surgery.  I have reviewed the patient's chart and labs.  Questions were answered to the patient's satisfaction.     Turner Daniels  MD

## 2021-05-03 NOTE — Transfer of Care (Signed)
Immediate Anesthesia Transfer of Care Note  Patient: Robert Novak  Procedure(s) Performed: UMBILICAL HERNIA REPAIR (Abdomen)  Patient Location: PACU  Anesthesia Type:General  Level of Consciousness: awake, alert  and oriented  Airway & Oxygen Therapy: Patient Spontanous Breathing and Patient connected to face mask oxygen  Post-op Assessment: Report given to RN and Post -op Vital signs reviewed and stable  Post vital signs: Reviewed and stable  Last Vitals:  Vitals Value Taken Time  BP 158/107 05/03/21 1826  Temp    Pulse 82 05/03/21 1828  Resp 27 05/03/21 1828  SpO2 99 % 05/03/21 1828  Vitals shown include unvalidated device data.  Last Pain:  Vitals:   05/03/21 1640  TempSrc:   PainSc: 0-No pain      Patients Stated Pain Goal: 0 ([id number])  Complications: No notable events documented.

## 2021-05-03 NOTE — Anesthesia Postprocedure Evaluation (Signed)
Anesthesia Post Note  Patient: Robert Novak  Procedure(s) Performed: UMBILICAL HERNIA REPAIR (Abdomen)     Patient location during evaluation: PACU Anesthesia Type: General Level of consciousness: awake and alert Pain management: pain level controlled Vital Signs Assessment: post-procedure vital signs reviewed and stable Respiratory status: spontaneous breathing, nonlabored ventilation and respiratory function stable Cardiovascular status: blood pressure returned to baseline and stable Postop Assessment: no apparent nausea or vomiting Anesthetic complications: no   No notable events documented.  Last Vitals:  Vitals:   05/03/21 1915 05/03/21 2011  BP: (!) 135/97 (!) 143/92  Pulse: 66 67  Resp: (!) 9 16  Temp:  36.5 C  SpO2: 97% 98%    Last Pain:  Vitals:   05/03/21 2011  TempSrc: Oral  PainSc:                  Juliet Vasbinder,W. EDMOND

## 2021-05-03 NOTE — ED Provider Notes (Addendum)
[facility] EMERGENCY DEPARTMENT Provider Note   CSN: [id number] Arrival date & time: 05/02/21  2020     History Chief Complaint  Patient presents with   Umbilical Hernia    Robert Novak is a 65 y.o. male.  Pt presents to the ED today with abdominal pain.  He has a hx of an umbilical hernia.  He said it normally does not hurt.  He said it started hurting very badly last night and he came to the ED.  Unfortunately, he had to wait several hours to be seen.  However, while he was waiting, labs and CT were done.  Pt denies any n/v.  He has not taken any of his meds last night or this morning b/c he has been here.  He is not on blood thinners.      Past Medical History:  Diagnosis Date   Allergy    pollen   Hyperlipidemia    Hypertension    MRSA cellulitis    Vitamin D deficiency     Patient Active Problem List   Diagnosis Date Noted   Umbilical hernia [id number]   CKD (chronic kidney disease) stage 2, GFR 60-89 ml/min 10/30/2019   Personal history of colonic polyps 06/09/2019   Obesity (BMI 30-39.9) 06/09/2019   Erectile dysfunction 04/09/2014   Hypertension    Hyperlipidemia     No past surgical history on file.     No family history on file.  Social History   Tobacco Use   Smoking status: Never   Smokeless tobacco: Never  Vaping Use   Vaping Use: Never used  Substance Use Topics   Alcohol use: No   Drug use: No    Home Medications Prior to Admission medications   Medication Sig Start Date End Date Taking? Authorizing Provider  amLODipine (NORVASC) 10 MG tablet Take 1 tablet (10 mg total) by mouth daily. 10/30/19   [place], Modena Nunnery, MD  atorvastatin (LIPITOR) 10 MG tablet Take 1 tablet (10 mg total) by mouth at bedtime. 07/15/19   [place], Modena Nunnery, MD  benzonatate (TESSALON) 100 MG capsule Take 1 capsule (100 mg total) by mouth 2 (two) times daily as needed for cough. Take first dose at nighttime and monitor for drowsiness.  If this  medication makes you drowsy, do not take while driving or operating heavy machinery. Patient taking differently: Take 100 mg by mouth 2 (two) times daily as needed for cough. 12/21/20   Eulogio Bear, NP  cetirizine (ZYRTEC) 10 MG tablet Take 1 tablet (10 mg total) by mouth daily. 12/21/20   Eulogio Bear, NP  fluticasone Asencion Islam) 50 MCG/ACT nasal spray Place 2 sprays into both nostrils daily. 12/21/20   Eulogio Bear, NP  GARLIC PO Take 1 tablet by mouth daily.    [provider]  Ginger, Zingiber officinalis, (GINGER PO) Take 1 tablet by mouth daily.    [provider]  metoprolol succinate (TOPROL-XL) 50 MG 24 hr tablet TAKE 1 TABLET BY MOUTH EVERY DAY Patient taking differently: Take 50 mg by mouth daily. 01/10/21   Susy Frizzle, MD  sildenafil (VIAGRA) 100 MG tablet Take 50-100 mg by mouth daily as needed for erectile dysfunction. 01/20/21   [provider]  tadalafil (CIALIS) 20 MG tablet Take 10-'20mg'$  1 hour before intercourse 07/15/19   [place], Modena Nunnery, MD  tiZANidine (ZANAFLEX) 4 MG tablet Take 1 tablet (4 mg total) by mouth 2 (two) times daily as needed for muscle spasms.  11/06/19   Alycia Rossetti, MD  triamcinolone cream (KENALOG) 0.1 % Apply 1 application topically 2 (two) times daily. 03/25/20   Alycia Rossetti, MD    Allergies    Patient has no known allergies.  Review of Systems   Review of Systems  Gastrointestinal:  Positive for abdominal pain.  All other systems reviewed and are negative.  Physical Exam Updated Vital Signs BP (!) 189/112 (BP Location: Right Arm)   Pulse (!) 59   Temp 98 F (36.7 C) (Oral)   Resp 18   Ht '5\' 4"'$  (1.626 m)   Wt 83.5 kg   SpO2 100%   BMI 31.58 kg/m   Physical Exam Vitals and nursing note reviewed.  Constitutional:      Appearance: Normal appearance. He is obese.  HENT:     Head: Normocephalic and atraumatic.     Right Ear: External ear normal.     Left Ear: External ear normal.      Nose: Nose normal.     Mouth/Throat:     Mouth: Mucous membranes are moist.     Pharynx: Oropharynx is clear.  Eyes:     Extraocular Movements: Extraocular movements intact.     Conjunctiva/sclera: Conjunctivae normal.     Pupils: Pupils are equal, round, and reactive to light.  Cardiovascular:     Rate and Rhythm: Normal rate and regular rhythm.     Pulses: Normal pulses.     Heart sounds: Normal heart sounds.  Pulmonary:     Effort: Pulmonary effort is normal.     Breath sounds: Normal breath sounds.  Abdominal:     General: Abdomen is flat. Bowel sounds are normal.     Palpations: Abdomen is soft.     Hernia: A hernia is present. Hernia is present in the umbilical area.     Comments: Unable to reduce hernia  Musculoskeletal:        General: Normal range of motion.     Cervical back: Normal range of motion and neck supple.  Skin:    General: Skin is warm.     Capillary Refill: Capillary refill takes less than 2 seconds.  Neurological:     General: No focal deficit present.     Mental Status: He is alert and oriented to person, place, and time.  Psychiatric:        Mood and Affect: Mood normal.        Behavior: Behavior normal.        Thought Content: Thought content normal.        Judgment: Judgment normal.    ED Results / Procedures / Treatments   Labs (all labs ordered are listed, but only abnormal results are displayed) Labs Reviewed  CBC WITH DIFFERENTIAL/PLATELET - Abnormal; Notable for the following components:      Result Value   RBC 5.93 (*)    MCV 79.3 (*)    All other components within normal limits  RESP PANEL BY RT-PCR (FLU A&B, COVID) ARPGX2  COMPREHENSIVE METABOLIC PANEL  HIV ANTIBODY (ROUTINE TESTING W REFLEX)    EKG None  Radiology CT ABDOMEN PELVIS W CONTRAST  Result Date: 05/03/2021 CLINICAL DATA:  Abdominal pain. EXAM: CT ABDOMEN AND PELVIS WITH CONTRAST TECHNIQUE: Multidetector CT imaging of the abdomen and pelvis was performed using the  standard protocol following bolus administration of intravenous contrast. CONTRAST:  39m OMNIPAQUE IOHEXOL 350 MG/ML SOLN COMPARISON:  None. FINDINGS: Lower chest: The visualized lung bases are clear. No intra-abdominal  free air or free fluid. Hepatobiliary: Probable fatty liver. No intrahepatic biliary dilatation. There is a 15 mm left hepatic cyst. The gallbladder is unremarkable. Pancreas: Unremarkable. No pancreatic ductal dilatation or surrounding inflammatory changes. Spleen: Normal in size without focal abnormality. Adrenals/Urinary Tract: The adrenal glands are unremarkable. The kidneys, visualized ureters, and urinary bladder appear unremarkable. Stomach/Bowel: There is herniation of a short segment of small bowel through a small umbilical defect. There is pinching of the herniated bowel at the neck of the hernia with associated dilatation of the small bowel proximal to the hernia measuring up to 3.6 cm in caliber. The distal small bowel demonstrate a normal caliber. The appendix is normal. Vascular/Lymphatic: Mild aortoiliac atherosclerotic disease. The IVC is unremarkable. No pain venous gas. There is no adenopathy. Reproductive: The prostate and seminal vesicles are grossly unremarkable. No pelvic mass. Other: None Musculoskeletal: Degenerative changes.  No acute osseous pathology. IMPRESSION: 1. Small-bowel obstruction secondary to an umbilical hernia. Clinical correlation and surgical consult is advised 2. Aortic Atherosclerosis (ICD10-I70.0). Electronically Signed   By: Anner Crete M.D.   On: 05/03/2021 03:13    Procedures Procedures   Medications Ordered in ED Medications  sodium chloride 0.9 % bolus 1,000 mL (has no administration in time range)  morphine 4 MG/ML injection 4 mg (has no administration in time range)  ondansetron (ZOFRAN) injection 4 mg (has no administration in time range)  enoxaparin (LOVENOX) injection 40 mg (has no administration in time range)  0.9 %  sodium  chloride infusion (has no administration in time range)  cefoTEtan (CEFOTAN) 2 g in sodium chloride 0.9 % 100 mL IVPB (has no administration in time range)  acetaminophen (TYLENOL) tablet 650 mg (has no administration in time range)    Or  acetaminophen (TYLENOL) suppository 650 mg (has no administration in time range)  oxyCODONE (Oxy IR/ROXICODONE) immediate release tablet 5-10 mg (has no administration in time range)  morphine 2 MG/ML injection 2 mg (has no administration in time range)  methocarbamol (ROBAXIN) tablet 500 mg (has no administration in time range)  diphenhydrAMINE (BENADRYL) 12.5 MG/5ML elixir 12.5 mg (has no administration in time range)    Or  diphenhydrAMINE (BENADRYL) injection 12.5 mg (has no administration in time range)  ondansetron (ZOFRAN-ODT) disintegrating tablet 4 mg (has no administration in time range)    Or  ondansetron (ZOFRAN) injection 4 mg (has no administration in time range)  hydrALAZINE (APRESOLINE) injection 10 mg (has no administration in time range)  iohexol (OMNIPAQUE) 350 MG/ML injection 75 mL (75 mLs Intravenous Contrast Given 05/03/21 0305)    ED Course  I have reviewed the triage vital signs and the nursing notes.  Pertinent labs & imaging results that were available during my care of the patient were reviewed by me and considered in my medical decision making (see chart for details).    MDM Rules/Calculators/A&P                           CT scan shows a sbo secondary to an umbilical hernia.  Pt is not vomiting, so I held off on NG tube.  Pt d/w gen surgery who will evaluate.  Gen surg will take pt to the OR today.  Final Clinical Impression(s) / ED Diagnoses Final diagnoses:  Incarcerated umbilical hernia  Small bowel obstruction ([place])    Rx / DC Orders ED Discharge Orders     None        Isla Pence,  MD 05/03/21 [id number]    Isla Pence, MD 05/03/21 [phone]

## 2021-05-04 ENCOUNTER — Encounter (HOSPITAL_COMMUNITY): Payer: Self-pay | Admitting: Surgery

## 2021-05-04 LAB — CBC
HCT: 43.9 % (ref 39.0–52.0)
Hemoglobin: 14.8 g/dL (ref 13.0–17.0)
MCH: 26.4 pg (ref 26.0–34.0)
MCHC: 33.7 g/dL (ref 30.0–36.0)
MCV: 78.4 fL — ABNORMAL LOW (ref 80.0–100.0)
Platelets: 174 10*3/uL (ref 150–400)
RBC: 5.6 MIL/uL (ref 4.22–5.81)
RDW: 13.5 % (ref 11.5–15.5)
WBC: 6.9 10*3/uL (ref 4.0–10.5)
nRBC: 0 % (ref 0.0–0.2)

## 2021-05-04 LAB — BASIC METABOLIC PANEL
Anion gap: 6 (ref 5–15)
BUN: 13 mg/dL (ref 8–23)
CO2: 23 mmol/L (ref 22–32)
Calcium: 8.5 mg/dL — ABNORMAL LOW (ref 8.9–10.3)
Chloride: 107 mmol/L (ref 98–111)
Creatinine, Ser: 1.21 mg/dL (ref 0.61–1.24)
GFR, Estimated: >60 mL/min (ref >60)
Glucose, Bld: 133 mg/dL — ABNORMAL HIGH (ref 70–99)
Potassium: 4.5 mmol/L (ref 3.5–5.1)
Sodium: 136 mmol/L (ref 135–145)

## 2021-05-04 MED ORDER — ACETAMINOPHEN 500 MG PO TABS: 1000.0000 mg | ORAL_TABLET | Freq: Four times a day (QID) | ORAL | Status: AC | PRN

## 2021-05-04 MED ORDER — OXYCODONE HCL 5 MG PO TABS: 5.0000 mg | ORAL_TABLET | Freq: Four times a day (QID) | ORAL | 0 refills | Status: DC | PRN

## 2021-05-04 NOTE — Progress Notes (Signed)
Discharge summary reviewed with patient. Questions were answered. Patient wants to eat lunch before leaving around noon when ride gets here.

## 2021-05-04 NOTE — Progress Notes (Signed)
Received pt from PACU, on a wheelchair, S/P Laparoscopic repair of Incarcerated umbilical hernia. Operative site clean, dry and intact with skin glue. Tolerated clear liquid diet. IV fluids started. Assisted to BR and back to bed, able to pass urine. Denies pain. All needs attended.

## 2021-05-04 NOTE — Discharge Summary (Signed)
Patient ID: Robert Novak [id number] 1956-06-10 65 y.o.  Admit date: 05/02/2021 Discharge date: 05/04/2021  Admitting Diagnosis: Incarcerated umbilical hernia HTN HLD  Discharge Diagnosis Patient Active Problem List   Diagnosis Date Noted   Umbilical hernia [id number]   Incarcerated umbilical hernia [id number]   CKD (chronic kidney disease) stage 2, GFR 60-89 ml/min 10/30/2019   Personal history of colonic polyps 06/09/2019   Obesity (BMI 30-39.9) 06/09/2019   Erectile dysfunction 04/09/2014   Hypertension    Hyperlipidemia   S/p open UHR with primary repair  Consultants none  Reason for Admission: Robert Novak is a 65 y.o. male with a hx of HTN, HLD who presented to the ED for umbilical hernia. Patient reports yesterday 6pm he was sitting down to use the restroom when he developed a sharp pain near his umbilicus. Since that time he has noticed a bulge at his umbilicus with constant, moderate to severe pain that is worse with palpation. The skin at the umbilicus has begun to turn red. No associated fever, chills, n/v. He continues to pass flatus. Last BM yeserday morning before his symptoms began. He was unaware that he had an umbilical hernia before this. CT A/P showed incarcerated umbilical hernia with SBO. No prior abdominal surgeries. He is not on any blood thinners. We were asked to see.   Procedures Open UHR with primary closure, Dr. Brantley Stage 05/03/21  Hospital Course:  The patient was admitted and under the above procedure. He tolerated this well. His diet was ADAT.  His pain was well controlled and he was voiding well.  On POD 1, the patient was stable for DC home.  Physical Exam: Abd: soft, appropriately tender, +BS, mild distention, but improving, incision is c/d/I with some mild ecchymosis surrounding the incision under the gauze and tegaderm  Allergies as of 05/04/2021   No Known Allergies      Medication List     TAKE these medications     acetaminophen 500 MG tablet Commonly known as: TYLENOL Take 2 tablets (1,000 mg total) by mouth every 6 (six) hours as needed.   amLODipine 10 MG tablet Commonly known as: NORVASC Take 1 tablet (10 mg total) by mouth daily.   atorvastatin 10 MG tablet Commonly known as: LIPITOR Take 1 tablet (10 mg total) by mouth at bedtime.   cetirizine 10 MG tablet Commonly known as: ZYRTEC Take 1 tablet (10 mg total) by mouth daily. What changed:  when to take this reasons to take this   GARLIC PO Take 1 tablet by mouth daily.   metoprolol succinate 50 MG 24 hr tablet Commonly known as: TOPROL-XL TAKE 1 TABLET BY MOUTH EVERY DAY   oxyCODONE 5 MG immediate release tablet Commonly known as: Oxy IR/ROXICODONE Take 1 tablet (5 mg total) by mouth every 6 (six) hours as needed for moderate pain.   tadalafil 20 MG tablet Commonly known as: Cialis Take 10-'20mg'$  1 hour before intercourse What changed:  how much to take how to take this when to take this reasons to take this additional instructions          Follow-up Information     Cornett, Thomas, MD Follow up in 3 week(s).   Specialty: General Surgery Why: Our office is working on your follow up. Please call to confirm your appointment date and time Contact information: [address] [place] [address] [phone]                 Signed:  Saverio Danker, [facility] 05/04/2021, 9:22 AM Please see Amion for pager number during day hours 7:00am-4:30pm, 7-11:30am on Weekends

## 2021-05-04 NOTE — Discharge Instructions (Addendum)
[facility]  UMBILICAL OR INGUINAL HERNIA REPAIR: POST OP INSTRUCTIONS  Always review your discharge instruction sheet given to you by the facility where your surgery was performed. IF YOU HAVE DISABILITY OR FAMILY LEAVE FORMS, YOU MUST BRING THEM TO THE OFFICE FOR PROCESSING.   DO NOT GIVE THEM TO YOUR DOCTOR.  A  prescription for pain medication may be given to you upon discharge.  Take your pain medication as prescribed, if needed.  If narcotic pain medicine is not needed, then you may take acetaminophen (Tylenol) or ibuprofen (Advil) as needed. Take your usually prescribed medications unless otherwise directed. If you need a refill on your pain medication, please contact your pharmacy.  They will contact our office to request authorization. Prescriptions will not be filled after 5 pm or on week-ends. You should follow a light diet the first 24 hours after arrival home, such as soup and crackers, etc.  Be sure to include lots of fluids daily.  Resume your normal diet the day after surgery. Most patients will experience some swelling and bruising around the umbilicus or in the groin and scrotum.  Ice packs and reclining will help.  Swelling and bruising can take several days to resolve.  It is common to experience some constipation if taking pain medication after surgery.  Increasing fluid intake and taking a stool softener (such as Colace) will usually help or prevent this problem from occurring.  A mild laxative (Milk of Magnesia or Miralax) should be taken according to package directions if there are no bowel movements after 48 hours. Unless discharge instructions indicate otherwise, you may remove your bandages 24-48 hours after surgery, and you may shower at that time.  You may have steri-strips (small skin tapes) in place directly over the incision.  These strips should be left on the skin for 7-10 days.  If your surgeon used skin glue on the incision, you may shower in 24  hours.  The glue will flake off over the next 2-3 weeks.  Any sutures or staples will be removed at the office during your follow-up visit. ACTIVITIES:  You may resume regular (light) daily activities beginning the next day--such as daily self-care, walking, climbing stairs--gradually increasing activities as tolerated.  You may have sexual intercourse when it is comfortable.  Refrain from any heavy lifting or straining until approved by your doctor. You may drive when you are no longer taking prescription pain medication, you can comfortably wear a seatbelt, and you can safely maneuver your car and apply brakes. RETURN TO WORK: 05/15/21 You should see your doctor in the office for a follow-up appointment approximately 2-3 weeks after your surgery.  Make sure that you call for this appointment within a day or two after you arrive home to insure a convenient appointment time    WHEN TO CALL YOUR DOCTOR: Fever over 101.0 Inability to urinate Nausea and/or vomiting Extreme swelling or bruising Continued bleeding from incision. Increased pain, redness, or drainage from the incision  The clinic staff is available to answer your questions during regular business hours.  Please don't hesitate to call and ask to speak to one of the nurses for clinical concerns.  If you have a medical emergency, go to the nearest emergency room or call 911.  A surgeon from [facility] is always on call at the hospital   [address] [place], [place], [place]  [address] ?  [address], [place], [place]   [address] [phone])  [id number] ? [phone] ? FAX [phone] Web site: [email or web address]

## 2021-05-08 ENCOUNTER — Telehealth: Payer: Self-pay

## 2021-05-08 ENCOUNTER — Other Ambulatory Visit: Payer: Self-pay | Admitting: Family Medicine

## 2021-05-08 DIAGNOSIS — I10 Essential (primary) hypertension: Secondary | ICD-10-CM

## 2021-05-08 MED ORDER — AMLODIPINE BESYLATE 10 MG PO TABS: 10.0000 mg | ORAL_TABLET | Freq: Every day | ORAL | 0 refills | Status: DC

## 2021-05-08 NOTE — Telephone Encounter (Signed)
Medication filled x1 with no refills.  

## 2021-05-08 NOTE — Telephone Encounter (Signed)
Pt came into office today needing to get a refill of amLODipine (NORVASC) 10 MG table. Pt was a pt of Dr. Buelah Manis and has not had a courtesy refill. Please advise.  Cb#: [phone]

## 2021-05-17 DIAGNOSIS — R972 Elevated prostate specific antigen [PSA]: Secondary | ICD-10-CM | POA: Diagnosis not present

## 2021-05-17 DIAGNOSIS — Z0000 Encounter for general adult medical examination without abnormal findings: Secondary | ICD-10-CM | POA: Diagnosis not present

## 2021-05-17 DIAGNOSIS — I10 Essential (primary) hypertension: Secondary | ICD-10-CM | POA: Diagnosis not present

## 2021-05-17 DIAGNOSIS — E782 Mixed hyperlipidemia: Secondary | ICD-10-CM | POA: Diagnosis not present

## 2021-05-17 DIAGNOSIS — N529 Male erectile dysfunction, unspecified: Secondary | ICD-10-CM | POA: Diagnosis not present

## 2021-08-06 ENCOUNTER — Other Ambulatory Visit: Payer: Self-pay | Admitting: Family Medicine

## 2021-08-06 DIAGNOSIS — I10 Essential (primary) hypertension: Secondary | ICD-10-CM

## 2021-10-11 ENCOUNTER — Other Ambulatory Visit: Payer: Self-pay | Admitting: Nurse Practitioner

## 2021-10-11 DIAGNOSIS — R059 Cough, unspecified: Secondary | ICD-10-CM

## 2022-01-23 IMAGING — CT CT ABD-PELV W/ CM
2 of 5 series · 16 of 46 positions shown, 18 images · IV contrast (omnipaque)
Comparison: None.

CLINICAL DATA: Abdominal pain.

EXAM:
CT ABDOMEN AND PELVIS WITH CONTRAST
TECHNIQUE: Multidetector CT imaging of the abdomen and pelvis was performed
using the standard protocol following bolus administration of
intravenous contrast.
CONTRAST:  75mL OMNIPAQUE IOHEXOL 350 MG/ML SOLN

[Series 3: a/p w/ 5mm · axial · 0.71mm/px · z∈[+937,+1347]mm · 13 of 92 slices shown, 15 images]
[im 5/92  soft-tissue]
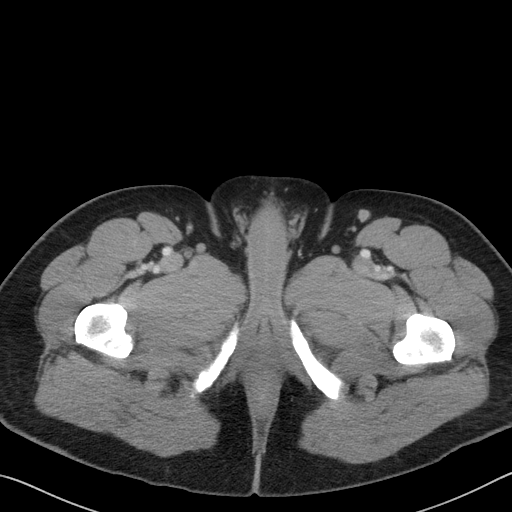
[im 5/92  bone]
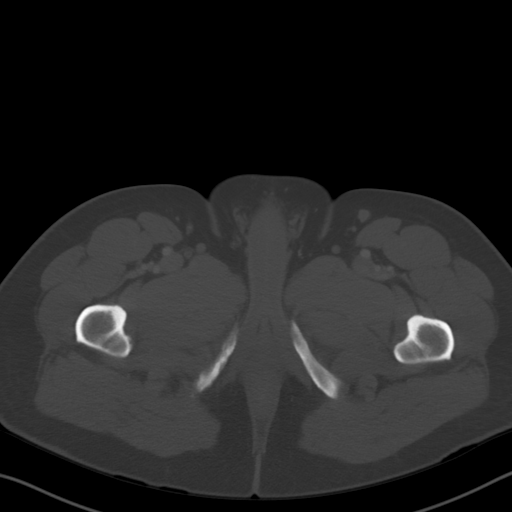
[im 14/92  soft-tissue]
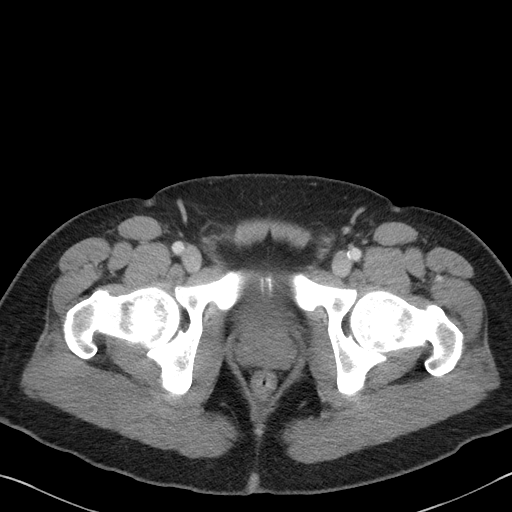
[im 19/92  soft-tissue]
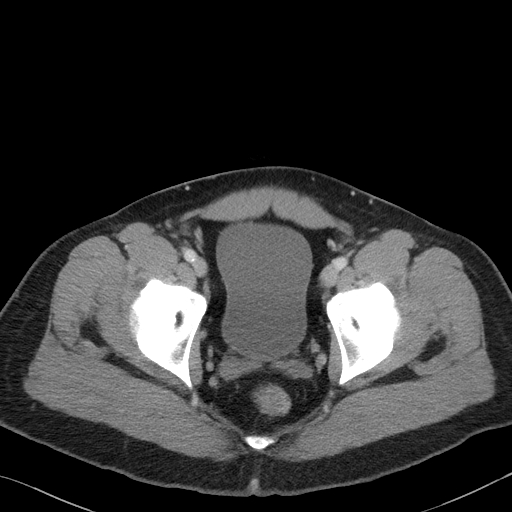
[im 28/92  soft-tissue]
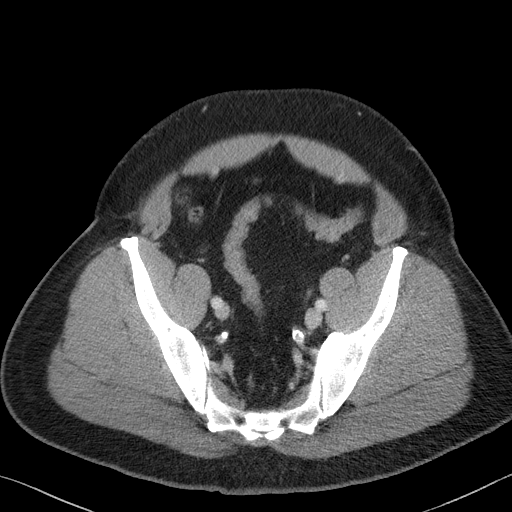
[im 32/92  soft-tissue]
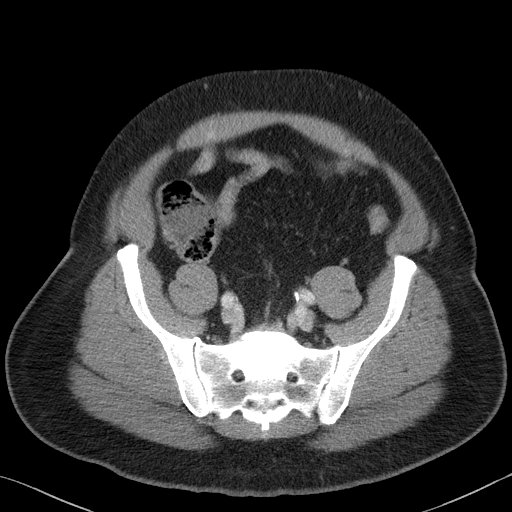
[im 41/92  soft-tissue]
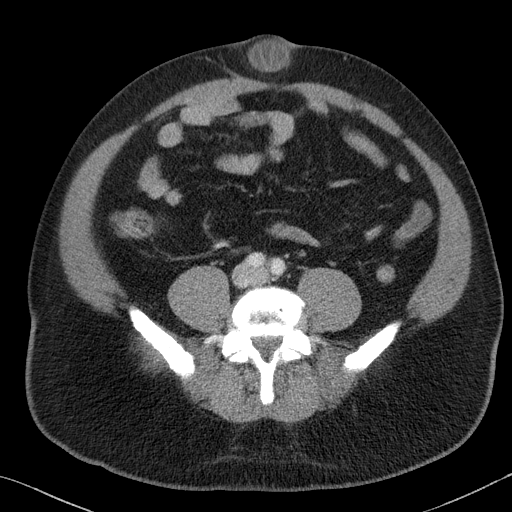
[im 46/92  soft-tissue]
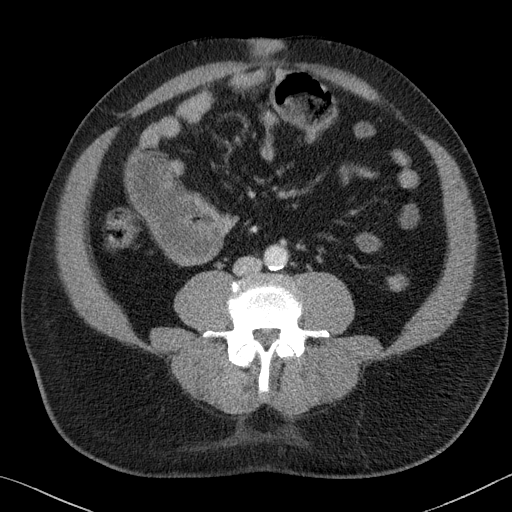
[im 51/92  soft-tissue]
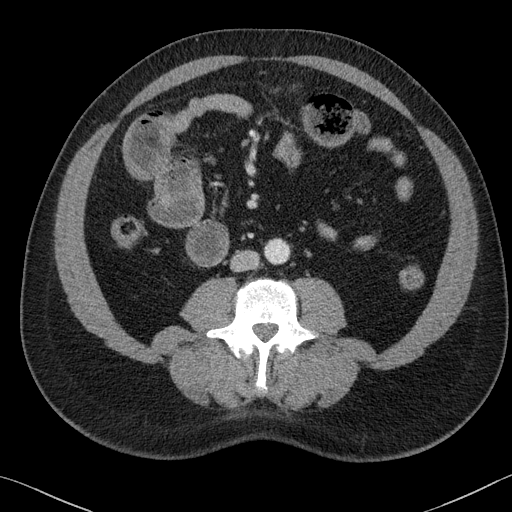
[im 60/92  soft-tissue]
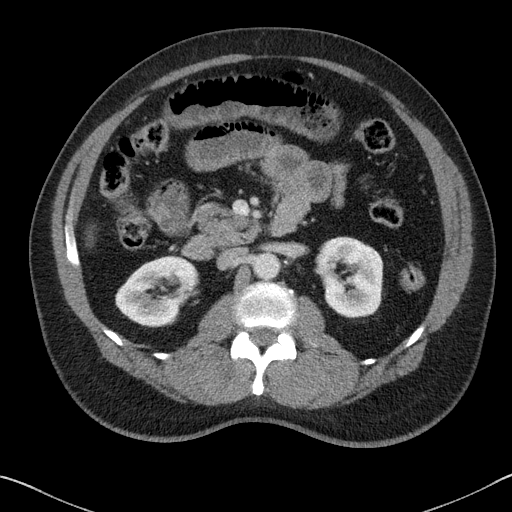
[im 60/92  bone]
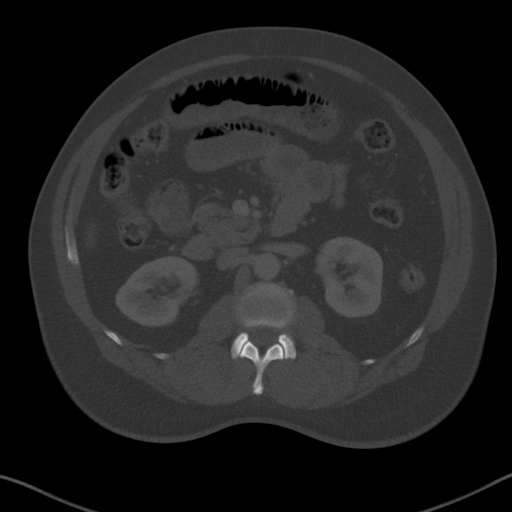
[im 64/92  soft-tissue]
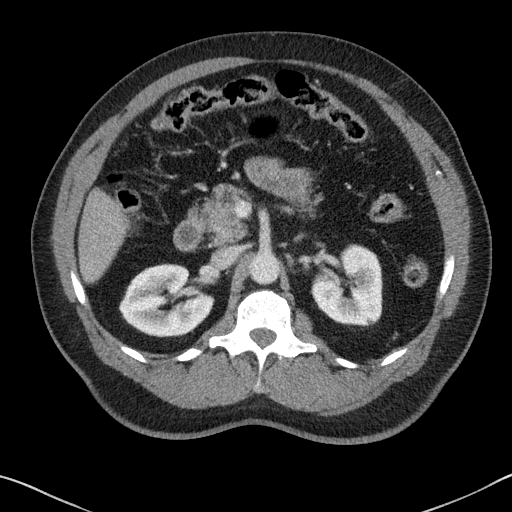
[im 73/92  soft-tissue]
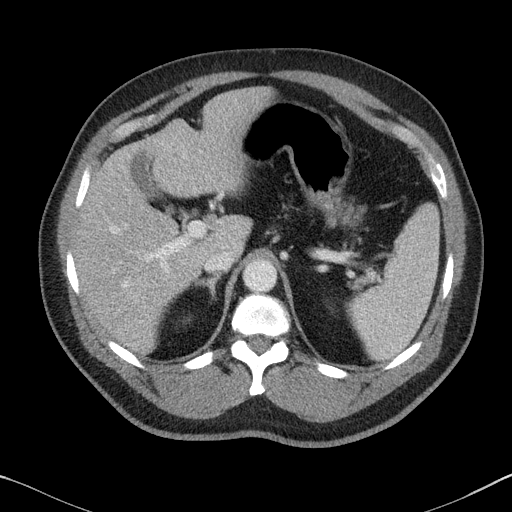
[im 78/92  soft-tissue]
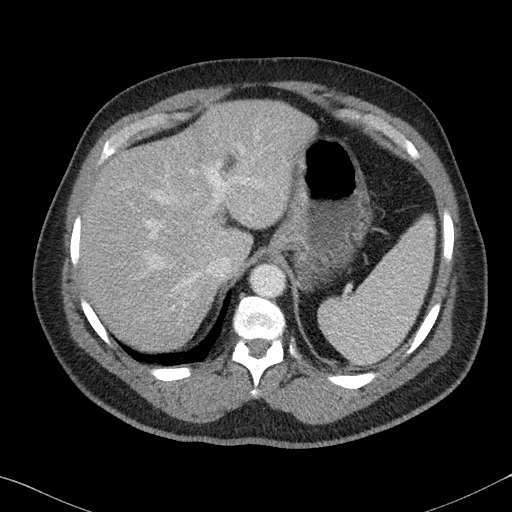
[im 87/92  soft-tissue]
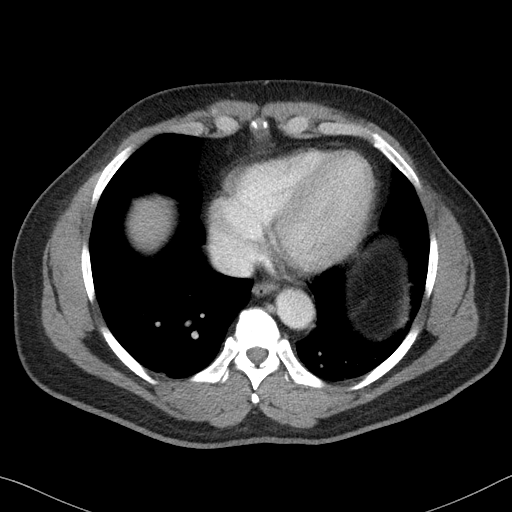

[Series 6: a/p w/ cor · coronal · 0.70mm/px · 3 of 159 slices shown]
[im 53/159  soft-tissue]
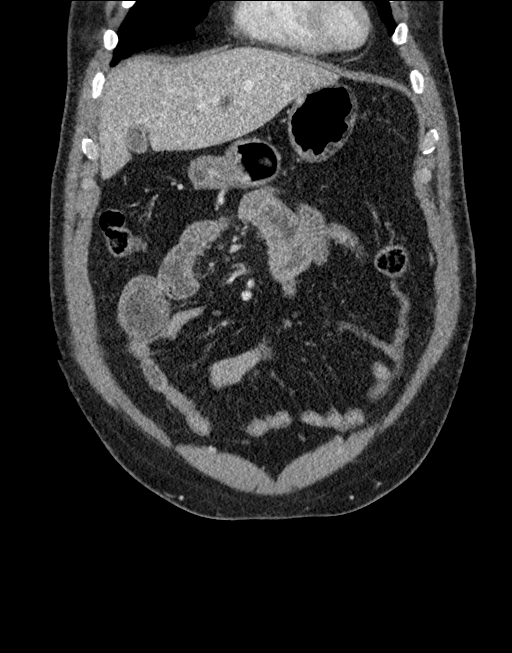
[im 71/159  soft-tissue]
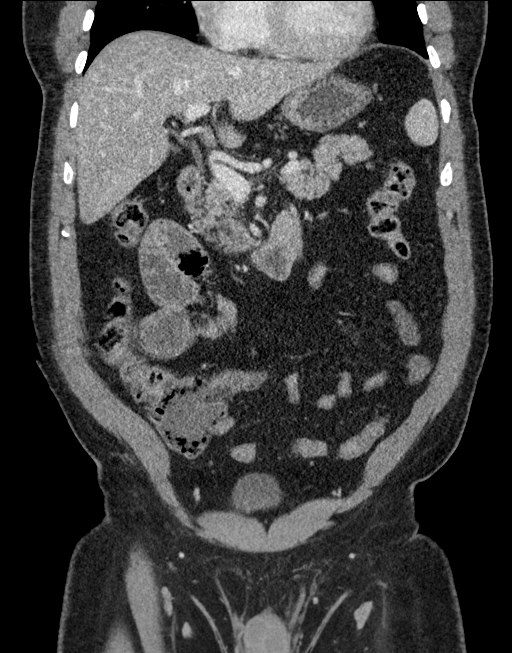
[im 88/159  soft-tissue]
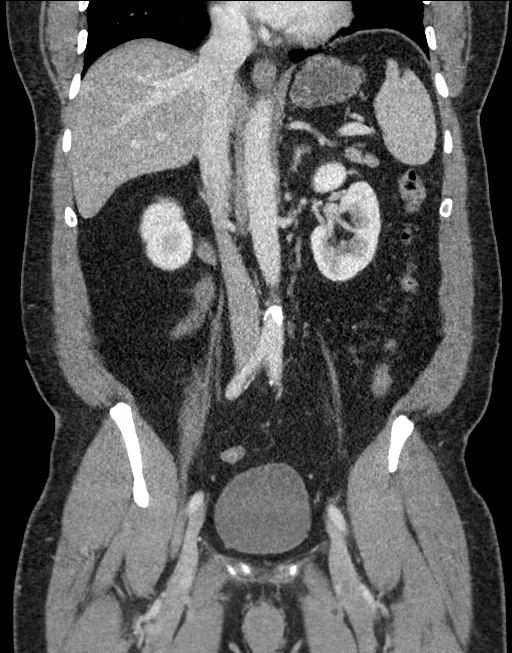

[16 of 46 positions shown; findings below may reference images not displayed]

FINDINGS: Lower chest: The visualized lung bases are clear.

No intra-abdominal free air or free fluid.

Hepatobiliary: Probable fatty liver. No intrahepatic biliary
dilatation. There is a 15 mm left hepatic cyst. The gallbladder is
unremarkable.

Pancreas: Unremarkable. No pancreatic ductal dilatation or
surrounding inflammatory changes.

Spleen: Normal in size without focal abnormality.

Adrenals/Urinary Tract: The adrenal glands are unremarkable. The
kidneys, visualized ureters, and urinary bladder appear
unremarkable.

Stomach/Bowel: There is herniation of a short segment of small bowel
through a small umbilical defect. There is pinching of the herniated
bowel at the neck of the hernia with associated dilatation of the
small bowel proximal to the hernia measuring up to 3.6 cm in
caliber. The distal small bowel demonstrate a normal caliber. The
appendix is normal.

Vascular/Lymphatic: Mild aortoiliac atherosclerotic disease. The IVC
is unremarkable. No pain venous gas. There is no adenopathy.

Reproductive: The prostate and seminal vesicles are grossly
unremarkable. No pelvic mass.

Other: None

Musculoskeletal: Degenerative changes.  No acute osseous pathology.
IMPRESSION: 1. Small-bowel obstruction secondary to an umbilical hernia.
Clinical correlation and surgical consult is advised
2. Aortic Atherosclerosis ([id number]).

## 2024-01-17 ENCOUNTER — Encounter (HOSPITAL_COMMUNITY): Payer: Self-pay | Admitting: *Deleted

## 2024-01-17 ENCOUNTER — Other Ambulatory Visit: Payer: Self-pay

## 2024-01-17 ENCOUNTER — Inpatient Hospital Stay (HOSPITAL_COMMUNITY)
Admission: EM | Admit: 2024-01-17 | Discharge: 2024-01-20 | DRG: 982 | Disposition: A | Discharge status: Home / Self Care | Attending: Internal Medicine | Admitting: Internal Medicine

## 2024-01-17 ENCOUNTER — Emergency Department (HOSPITAL_COMMUNITY)

## 2024-01-17 DIAGNOSIS — Z23 Encounter for immunization: Secondary | ICD-10-CM

## 2024-01-17 DIAGNOSIS — L03012 Cellulitis of left finger: Secondary | ICD-10-CM | POA: Diagnosis present

## 2024-01-17 DIAGNOSIS — Z79899 Other long term (current) drug therapy: Secondary | ICD-10-CM | POA: Diagnosis not present

## 2024-01-17 DIAGNOSIS — Z6831 Body mass index (BMI) 31.0-31.9, adult: Secondary | ICD-10-CM | POA: Diagnosis not present

## 2024-01-17 DIAGNOSIS — M65942 Unspecified synovitis and tenosynovitis, left hand: Secondary | ICD-10-CM | POA: Diagnosis present

## 2024-01-17 DIAGNOSIS — W298XXA Contact with other powered powered hand tools and household machinery, initial encounter: Secondary | ICD-10-CM | POA: Diagnosis not present

## 2024-01-17 DIAGNOSIS — L02512 Cutaneous abscess of left hand: Secondary | ICD-10-CM | POA: Diagnosis present

## 2024-01-17 DIAGNOSIS — S6982XA Other specified injuries of left wrist, hand and finger(s), initial encounter: Secondary | ICD-10-CM | POA: Diagnosis present

## 2024-01-17 DIAGNOSIS — E785 Hyperlipidemia, unspecified: Secondary | ICD-10-CM | POA: Diagnosis present

## 2024-01-17 DIAGNOSIS — N182 Chronic kidney disease, stage 2 (mild): Secondary | ICD-10-CM | POA: Diagnosis present

## 2024-01-17 DIAGNOSIS — I129 Hypertensive chronic kidney disease with stage 1 through stage 4 chronic kidney disease, or unspecified chronic kidney disease: Secondary | ICD-10-CM | POA: Diagnosis present

## 2024-01-17 DIAGNOSIS — B9562 Methicillin resistant Staphylococcus aureus infection as the cause of diseases classified elsewhere: Secondary | ICD-10-CM | POA: Diagnosis present

## 2024-01-17 DIAGNOSIS — I10 Essential (primary) hypertension: Secondary | ICD-10-CM | POA: Diagnosis present

## 2024-01-17 DIAGNOSIS — E66811 Obesity, class 1: Secondary | ICD-10-CM | POA: Diagnosis present

## 2024-01-17 LAB — COMPREHENSIVE METABOLIC PANEL WITH GFR
ALT: 19 U/L (ref 0–44)
AST: 20 U/L (ref 15–41)
Albumin: 3.9 g/dL (ref 3.5–5.0)
Alkaline Phosphatase: 65 U/L (ref 38–126)
Anion gap: 10 (ref 5–15)
BUN: 15 mg/dL (ref 8–23)
CO2: 24 mmol/L (ref 22–32)
Calcium: 8.9 mg/dL (ref 8.9–10.3)
Chloride: 102 mmol/L (ref 98–111)
Creatinine, Ser: 1.4 mg/dL — ABNORMAL HIGH (ref 0.61–1.24)
GFR, Estimated: 55 mL/min — ABNORMAL LOW (ref >60)
Glucose, Bld: 102 mg/dL — ABNORMAL HIGH (ref 70–99)
Potassium: 4.3 mmol/L (ref 3.5–5.1)
Sodium: 136 mmol/L (ref 135–145)
Total Bilirubin: 1.5 mg/dL — ABNORMAL HIGH (ref 0.0–1.2)
Total Protein: 7.2 g/dL (ref 6.5–8.1)

## 2024-01-17 LAB — CBC
HCT: 45.6 % (ref 39.0–52.0)
Hemoglobin: 15.3 g/dL (ref 13.0–17.0)
MCH: 26.1 pg (ref 26.0–34.0)
MCHC: 33.6 g/dL (ref 30.0–36.0)
MCV: 77.7 fL — ABNORMAL LOW (ref 80.0–100.0)
Platelets: 197 10*3/uL (ref 150–400)
RBC: 5.87 MIL/uL — ABNORMAL HIGH (ref 4.22–5.81)
RDW: 13.2 % (ref 11.5–15.5)
WBC: 12.3 10*3/uL — ABNORMAL HIGH (ref 4.0–10.5)
nRBC: 0 % (ref 0.0–0.2)

## 2024-01-17 MED ORDER — VANCOMYCIN HCL IN DEXTROSE 1-5 GM/200ML-% IV SOLN
1000.0000 mg | INTRAVENOUS | Status: DC
  Administered 2024-01-18 – 2024-01-19 (×2): 1000 mg via INTRAVENOUS
  Filled 2024-01-17 (×2): qty 200

## 2024-01-17 MED ORDER — VANCOMYCIN HCL 1500 MG/300ML IV SOLN
1500.0000 mg | Freq: Once | INTRAVENOUS | Status: AC
  Administered 2024-01-17: 1500 mg via INTRAVENOUS
  Filled 2024-01-17: qty 300

## 2024-01-17 MED ORDER — OXYCODONE HCL 5 MG PO TABS: 5.0000 mg | ORAL_TABLET | ORAL | Status: DC | PRN

## 2024-01-17 MED ORDER — POLYETHYLENE GLYCOL 3350 17 G PO PACK: 17.0000 g | PACK | Freq: Every day | ORAL | Status: DC | PRN

## 2024-01-17 MED ORDER — HYDROMORPHONE HCL 1 MG/ML IJ SOLN
0.5000 mg | INTRAMUSCULAR | Status: DC | PRN
  Administered 2024-01-17 – 2024-01-20 (×6): 0.5 mg via INTRAVENOUS
  Filled 2024-01-17 (×7): qty 0.5

## 2024-01-17 MED ORDER — VANCOMYCIN HCL 1.25 G IV SOLR: 1250.0000 mg | Freq: Once | INTRAVENOUS | Status: DC

## 2024-01-17 MED ORDER — SODIUM CHLORIDE 0.9 % IV SOLN: INTRAVENOUS | Status: AC

## 2024-01-17 MED ORDER — TETANUS-DIPHTH-ACELL PERTUSSIS 5-2.5-18.5 LF-MCG/0.5 IM SUSY
0.5000 mL | PREFILLED_SYRINGE | Freq: Once | INTRAMUSCULAR | Status: AC
  Administered 2024-01-17: 0.5 mL via INTRAMUSCULAR
  Filled 2024-01-17: qty 0.5

## 2024-01-17 MED ORDER — MORPHINE SULFATE (PF) 4 MG/ML IV SOLN
4.0000 mg | Freq: Once | INTRAVENOUS | Status: AC
  Administered 2024-01-17: 4 mg via INTRAVENOUS
  Filled 2024-01-17: qty 1

## 2024-01-17 MED ORDER — ATORVASTATIN CALCIUM 10 MG PO TABS
10.0000 mg | ORAL_TABLET | Freq: Every day | ORAL | Status: DC
  Administered 2024-01-17 – 2024-01-19 (×3): 10 mg via ORAL
  Filled 2024-01-17 (×3): qty 1

## 2024-01-17 MED ORDER — AMLODIPINE BESYLATE 10 MG PO TABS
10.0000 mg | ORAL_TABLET | Freq: Every day | ORAL | Status: DC
  Administered 2024-01-19 – 2024-01-20 (×2): 10 mg via ORAL
  Filled 2024-01-17 (×2): qty 1

## 2024-01-17 MED ORDER — ACETAMINOPHEN 325 MG PO TABS
650.0000 mg | ORAL_TABLET | Freq: Four times a day (QID) | ORAL | Status: DC | PRN
  Administered 2024-01-17 – 2024-01-18 (×4): 650 mg via ORAL
  Filled 2024-01-17 (×6): qty 2

## 2024-01-17 MED ORDER — ACETAMINOPHEN 650 MG RE SUPP: 650.0000 mg | Freq: Four times a day (QID) | RECTAL | Status: DC | PRN

## 2024-01-17 MED ORDER — PROCHLORPERAZINE EDISYLATE 10 MG/2ML IJ SOLN: 10.0000 mg | Freq: Four times a day (QID) | INTRAMUSCULAR | Status: DC | PRN

## 2024-01-17 MED ORDER — BISACODYL 5 MG PO TBEC: 5.0000 mg | DELAYED_RELEASE_TABLET | Freq: Every day | ORAL | Status: DC | PRN

## 2024-01-17 MED ORDER — LACTATED RINGERS IV BOLUS
500.0000 mL | Freq: Once | INTRAVENOUS | Status: AC
  Administered 2024-01-17: 500 mL via INTRAVENOUS

## 2024-01-17 MED ORDER — SODIUM CHLORIDE 0.9 % IV SOLN
2.0000 g | INTRAVENOUS | Status: DC
  Administered 2024-01-17 – 2024-01-19 (×4): 2 g via INTRAVENOUS
  Filled 2024-01-17 (×4): qty 20

## 2024-01-17 NOTE — ED Notes (Signed)
 Charge RN made aware patient being transport to unit at this time

## 2024-01-17 NOTE — Progress Notes (Signed)
 Pharmacy Antibiotic Note  Edward Rucki is a 68 y.o. male admitted on 01/17/2024 with cellulitis.  Pharmacy has been consulted for vancomycin dosing. Patient with mild AKI, Scr: 1.40 baseline around 1.20.   Plan: Ceftriaxone 2g q24h per MD.  Vancomycin 1000mg  q24h, eAUC: 411 utilizing Vd: 0.72, Scr: 1.4. BMI borderline to use Vd 0.5 or 0.72, anticipate renal fxn to improve.  Follow culture data for de-escalation.  Monitor renal function for dose adjustments as indicated.    Height: 5\' 4"  (162.6 cm) Weight: 83.5 kg (184 lb 1.4 oz) IBW/kg (Calculated) : 59.2  Temp (24hrs), Avg:98.7 F (37.1 C), Min:98.7 F (37.1 C), Max:98.7 F (37.1 C)  Recent Labs  Lab 01/17/24 1650  WBC 12.3*  CREATININE 1.40*    Estimated Creatinine Clearance: 50.6 mL/min (A) (by C-G formula based on SCr of 1.4 mg/dL (H)).    No Known Allergies  Antimicrobials this admission: Vancomycin 5/9 >>  Ceftriaxone 5/9 >>   Microbiology results: 5/9 BCx:   Thank you for allowing pharmacy to be a part of this patient's care.  Mamie Searles, PharmD, BCCCP  01/17/2024 8:07 PM

## 2024-01-17 NOTE — ED Triage Notes (Signed)
 The pt injured his  lt index finger last week with a pressure washer.  The entire hand is swollen and the index finger is swollen and painful  he also has swollen areas under his arms at  the same time  he was seen  at  a clinic in [place] and sent here to be treated here

## 2024-01-17 NOTE — H&P (Signed)
 History and Physical    Robert Novak NFA:[id number] DOB: 06-30-1956 DOA: 01/17/2024  PCP: Will Hare, NP   Patient coming from: Home   Chief Complaint: Left index finger injury, swelling, pain, drainage   HPI: Robert Novak is a 68 y.o. male with medical history significant for hypertension, hyperlipidemia, and CKD stage II who presents with worsening pain, swelling, and also purulent drainage from his left index finger after recent injury.  Patient reports that he was using a pressure washer on 01/11/2024 when he injured his left index finger with the spray.  He developed a small wound on the palmar aspect of the left index finger and has since developed progressive swelling, erythema, pain, and drainage.  He has had chills and subjective fever since yesterday.  Erythema has spread to involve the radial aspect of the left hand and he has also noted tender adenopathy in his left axilla.  He is unable to flex or extend the finger due to severe pain.  ED Course: Upon arrival to the ED, patient is found to be afebrile with normal HR and stable BP.  Labs are most notable for creatinine 1.40 and WBC 12,300.  Hand surgery was consulted by the ED physician and the patient was given 500 mL of LR, Tdap, vancomycin, and morphine .  Review of Systems:  All other systems reviewed and apart from HPI, are negative.  Past Medical History:  Diagnosis Date   Allergy    pollen   Hyperlipidemia    Hypertension    MRSA cellulitis    Vitamin D deficiency     Past Surgical History:  Procedure Laterality Date   UMBILICAL HERNIA REPAIR N/A 05/03/2021   Procedure: UMBILICAL HERNIA REPAIR;  Surgeon: Sim Dryer, MD;  Location: [facility] OR;  Service: General;  Laterality: N/A;    Social History:   reports that he has never smoked. He has never used smokeless tobacco. He reports that he does not drink alcohol and does not use drugs.  No Known Allergies  History reviewed. No pertinent family  history.   Prior to Admission medications   Medication Sig Start Date End Date Taking? Authorizing Provider  acetaminophen  (TYLENOL ) 500 MG tablet Take 2 tablets (1,000 mg total) by mouth every 6 (six) hours as needed. 05/04/21   Marlin Simmonds, PA-C  amLODipine  (NORVASC ) 10 MG tablet TAKE 1 TABLET BY MOUTH EVERY DAY 08/07/21   Austine Lefort, MD  atorvastatin  (LIPITOR) 10 MG tablet Take 1 tablet (10 mg total) by mouth at bedtime. 07/15/19   Matador, Marvell Slider, MD  cetirizine  (ZYRTEC ) 10 MG tablet Take 1 tablet (10 mg total) by mouth daily. Patient taking differently: Take 10 mg by mouth daily as needed for allergies. 12/21/20   Wilhemena Harbour, NP  GARLIC PO Take 1 tablet by mouth daily.    [provider]  lisinopril  (ZESTRIL ) 5 MG tablet Take 5 mg by mouth daily. 12/12/23 12/11/24  [provider]  metoprolol  succinate (TOPROL -XL) 50 MG 24 hr tablet TAKE 1 TABLET BY MOUTH EVERY DAY Patient not taking: No sig reported 01/10/21   Austine Lefort, MD  oxyCODONE  (OXY IR/ROXICODONE ) 5 MG immediate release tablet Take 1 tablet (5 mg total) by mouth every 6 (six) hours as needed for moderate pain. 05/04/21   Marlin Simmonds, PA-C  rosuvastatin (CRESTOR) 5 MG tablet Take 5 mg by mouth daily. 01/15/24 01/14/25  [provider]  sildenafil  (VIAGRA ) 100 MG tablet Take 100 mg by mouth as needed for  erectile dysfunction. 01/06/24 02/05/24  [provider]  tadalafil  (CIALIS ) 20 MG tablet Take 10-20mg  1 hour before intercourse Patient taking differently: Take 10-20 mg by mouth daily as needed for erectile dysfunction. 07/15/19   Mathis Som, MD    Physical Exam: Vitals:   01/17/24 1635 01/17/24 1638  BP: 113/79   Pulse: 84   Resp: 16   Temp: 98.7 F (37.1 C)   SpO2: 97%   Weight:  83.5 kg  Height:  5\' 4"  (1.626 m)    Constitutional: NAD, calm  Eyes: PERTLA, lids and conjunctivae normal ENMT: Mucous membranes are moist. Posterior pharynx clear of any exudate or  lesions.   Neck: supple, no masses  Respiratory: no wheezing, no crackles. No accessory muscle use.  Cardiovascular: S1 & S2 heard, regular rate and rhythm. No JVD. Abdomen: No distension, no tenderness, soft. Bowel sounds active.  Musculoskeletal: no clubbing / cyanosis. Left index finger edematous, erythematous, hot, and tender.   Skin: no significant rashes, lesions, ulcers. Warm, dry, well-perfused. Neurologic: CN 2-12 grossly intact. Moving all extremities. Alert and oriented.  Psychiatric: Pleasant. Cooperative.    Labs and Imaging on Admission: I have personally reviewed following labs and imaging studies  CBC: Recent Labs  Lab 01/17/24 1650  WBC 12.3*  HGB 15.3  HCT 45.6  MCV 77.7*  PLT 197   Basic Metabolic Panel: Recent Labs  Lab 01/17/24 1650  NA 136  K 4.3  CL 102  CO2 24  GLUCOSE 102*  BUN 15  CREATININE 1.40*  CALCIUM  8.9   GFR: Estimated Creatinine Clearance: 50.6 mL/min (A) (by C-G formula based on SCr of 1.4 mg/dL (H)). Liver Function Tests: Recent Labs  Lab 01/17/24 1650  AST 20  ALT 19  ALKPHOS 65  BILITOT 1.5*  PROT 7.2  ALBUMIN 3.9   No results for input(s): "LIPASE", "AMYLASE" in the last 168 hours. No results for input(s): "AMMONIA" in the last 168 hours. Coagulation Profile: No results for input(s): "INR", "PROTIME" in the last 168 hours. Cardiac Enzymes: No results for input(s): "CKTOTAL", "CKMB", "CKMBINDEX", "TROPONINI" in the last 168 hours. BNP (last 3 results) No results for input(s): "PROBNP" in the last 8760 hours. HbA1C: No results for input(s): "HGBA1C" in the last 72 hours. CBG: No results for input(s): "GLUCAP" in the last 168 hours. Lipid Profile: No results for input(s): "CHOL", "HDL", "LDLCALC", "TRIG", "CHOLHDL", "LDLDIRECT" in the last 72 hours. Thyroid  Function Tests: No results for input(s): "TSH", "T4TOTAL", "FREET4", "T3FREE", "THYROIDAB" in the last 72 hours. Anemia Panel: No results for input(s):  "VITAMINB12", "FOLATE", "FERRITIN", "TIBC", "IRON", "RETICCTPCT" in the last 72 hours. Urine analysis: No results found for: "COLORURINE", "APPEARANCEUR", "LABSPEC", "PHURINE", "GLUCOSEU", "HGBUR", "BILIRUBINUR", "KETONESUR", "PROTEINUR", "UROBILINOGEN", "NITRITE", "LEUKOCYTESUR" Sepsis Labs: @LABRCNTIP (procalcitonin:4,lacticidven:4) )No results found for this or any previous visit (from the past 240 hours).   Radiological Exams on Admission: DG Finger Index Right Result Date: 01/17/2024 CLINICAL DATA:  the pt has a red swollen index finger since he was using a pressure washer last week and struck the finger with the pressure washer EXAM: RIGHT INDEX FINGER 2+V COMPARISON:  None Available. FINDINGS: There is no evidence of fracture or dislocation. No erosive change or bony destruction. Minimal degenerative spurring of the distal interphalangeal joint and metacarpal phalangeal joint. Generalized soft tissue edema and thickening. No radiopaque foreign body or soft tissue gas. IMPRESSION: 1. Generalized soft tissue edema and thickening. No acute osseous abnormality. 2. Minimal degenerative spurring of the distal interphalangeal joint and  metacarpophalangeal joint. Electronically Signed   By: Chadwick Colonel M.D.   On: 01/17/2024 18:34    Assessment/Plan   1. Left index finger infection  - Continue supportive care, treat with vancomycin and Rocephin, keep NPO after midnight   2. Hypertension  - Continue amlodipine    3. Hyperlipidemia  - Lipitor    4. CKD stage II  - SCr is 1.40 on admission, up from 1.1 on 01/06/24  - Hold lisinopril  for now, monitor     DVT prophylaxis: SCDs  Code Status: Full  Level of Care: Level of care: Med-Surg Family Communication: family at bedside   Disposition Plan:  Patient is from: Home  Anticipated d/c is to: home  Anticipated d/c date is: 01/19/24  Patient currently: Pending hand surgery consultation  Consults called: Hand surgery  Admission status:  Inpatient     Walton Guppy, MD [facility]  01/17/2024, 8:06 PM

## 2024-01-17 NOTE — ED Provider Notes (Signed)
 I saw and evaluated the patient, reviewed the resident's note and I agree with the findings and plan.   68 year old male presents due to injury to his left hand from a pressure injector.  Has evidence of tenosynovitis.  Consulted hand surgery who recommends medicine admission and he will take patient for washout tomorrow.  Started on IV antibiotics.   Lind Repine, MD 01/17/24 [id number]

## 2024-01-17 NOTE — ED Triage Notes (Cosign Needed)
 The pt was given an injection of rocephin at the clinic that he left

## 2024-01-17 NOTE — ED Provider Notes (Signed)
 [facility] AT [facility] Provider Note  History  Chief Complaint:  Finger Injury   Trauma Mechanism of injury: High pressure injury via pressure washer Injury location: finger Injury location detail: L index finger Incident location: home Time since incident: 6 days Arrived directly from scene: no   Relevant PMH:      Pharmacological risk factors:            No anticoagulation therapy.     Marius Danehy is a 68 y.o. male with a history of MRSA cellulitis who presents the emergency department after pressure washer injury to his left index finger.  Past Medical History:  Diagnosis Date   Allergy    pollen   Hyperlipidemia    Hypertension    MRSA cellulitis    Vitamin D deficiency     Past Surgical History:  Procedure Laterality Date   UMBILICAL HERNIA REPAIR N/A 05/03/2021   Procedure: UMBILICAL HERNIA REPAIR;  Surgeon: Sim Dryer, MD;  Location: [facility] OR;  Service: General;  Laterality: N/A;    History reviewed. No pertinent family history.  Social History   Tobacco Use   Smoking status: Never   Smokeless tobacco: Never  Vaping Use   Vaping status: Never Used  Substance Use Topics   Alcohol use: No   Drug use: No    Review of Systems  Review of Systems   Reviewed and documented in HPI if pertinent.   Physical Exam   ED Triage Vitals  Encounter Vitals Group     BP 01/17/24 1635 113/79     Systolic BP Percentile --      Diastolic BP Percentile --      Pulse Rate 01/17/24 1635 84     Resp 01/17/24 1635 16     Temp 01/17/24 1635 98.7 F (37.1 C)     Temp src --      SpO2 01/17/24 1635 97 %     Weight 01/17/24 1638 184 lb 1.4 oz (83.5 kg)     Height 01/17/24 1638 5\' 4"  (1.626 m)     Head Circumference --      Peak Flow --      Pain Score 01/17/24 1638 8     Pain Loc --      Pain Education --      Exclude from Growth Chart --      Physical Exam Vitals and nursing note reviewed.  Constitutional:       General: He is not in acute distress.    Appearance: He is well-developed.  HENT:     Head: Normocephalic and atraumatic.  Eyes:     Conjunctiva/sclera: Conjunctivae normal.  Cardiovascular:     Rate and Rhythm: Normal rate and regular rhythm.     Heart sounds: No murmur heard. Pulmonary:     Effort: Pulmonary effort is normal. No respiratory distress.     Breath sounds: Normal breath sounds.  Abdominal:     Palpations: Abdomen is soft.     Tenderness: There is no abdominal tenderness.  Musculoskeletal:        General: No swelling.     Cervical back: Neck supple.     Comments: Severe pain on palpation to L index finger; swollen throughout 2nd digit; axillary lymphadenopathy to R and L axilla   Skin:    General: Skin is warm and dry.     Capillary Refill: Capillary refill takes less than 2 seconds.  Neurological:     Mental  Status: He is alert.  Psychiatric:        Mood and Affect: Mood normal.      Procedures   Procedures  ED Course - Medical Decision Making  Brief Overview Diana Gershon is a 68 y.o. male who presents as per above.  I have reviewed the nursing documentation for past medical history, family history, and social history and agree.  I have reviewed the patient's vital signs. There are no abnormalities.  Initial Differential Diagnoses: I am primarily concerned for flexor tenosynovitis, cellulitis, abscess.  Therapies: These medications and interventions were provided for the patient while in the ED.  Medications  vancomycin (VANCOREADY) IVPB 1500 mg/300 mL (has no administration in time range)  lactated ringers  bolus 500 mL (1 mL Intravenous New Bag/Given 01/17/24 1855)  Tdap (BOOSTRIX) injection 0.5 mL (0.5 mLs Intramuscular Given 01/17/24 1903)  morphine  (PF) 4 MG/ML injection 4 mg (4 mg Intravenous Given 01/17/24 1902)    Testing Results: On my interpretation labs are significant for : Mild leukocytosis Creatinine 1.40  On my interpretation imaging  is significant for: Soft tissue edema without fracture or dislocation  See the EMR for full details regarding lab and imaging results.   Medical Decision Making 68 year old male who presents the emergency department here for left index finger swelling.  On exam he does have a swollen left index finger that is severely tender.  There is purulent material draining out of the wound expressed at the bedside.  He states this happened 6 days ago.  He does have axillary lymphadenopathy on the right as well as the left axilla.  Lungs and heart exam is normal.  No other wounds.  Laboratory studies reveal a elevated creatinine.  Therefore we did give a bolus of fluids.  Due to the purulence expressed in the wound and the high-pressure injury I have started the patient on vancomycin due to concerns for MRSA.  Patient has had a history of MRSA in the past.  I have also discussed the case with hand surgery.  They state that they will post the patient for washout tomorrow morning.  They have asked for hospital medicine admission for IV antibiotics.  I did discuss with hospital medicine they will admit the patient to their service.  Risk Prescription drug management. Decision regarding hospitalization.     ### All radiography studies, electrocardiograms, and laboratory data were personally reviewed by me and incorporated into my medical decision making. Impression   1. High-pressure injection injury of finger of left hand, initial encounter      Note: Engineer, civil (consulting) software was used in the creation of this note.

## 2024-01-18 ENCOUNTER — Other Ambulatory Visit: Payer: Self-pay

## 2024-01-18 ENCOUNTER — Encounter (HOSPITAL_COMMUNITY): Payer: Self-pay | Admitting: Family Medicine

## 2024-01-18 ENCOUNTER — Encounter (HOSPITAL_COMMUNITY)
Admission: EM | Disposition: A | Discharge status: Home / Self Care | Payer: Self-pay | Source: Home / Self Care | Attending: Internal Medicine

## 2024-01-18 ENCOUNTER — Inpatient Hospital Stay (HOSPITAL_COMMUNITY): Admitting: Anesthesiology

## 2024-01-18 DIAGNOSIS — L03012 Cellulitis of left finger: Secondary | ICD-10-CM | POA: Diagnosis not present

## 2024-01-18 HISTORY — PX: INCISION AND DRAINAGE ABSCESS: SHX5864

## 2024-01-18 LAB — BASIC METABOLIC PANEL WITH GFR
Anion gap: 12 (ref 5–15)
BUN: 18 mg/dL (ref 8–23)
CO2: 21 mmol/L — ABNORMAL LOW (ref 22–32)
Calcium: 8.3 mg/dL — ABNORMAL LOW (ref 8.9–10.3)
Chloride: 106 mmol/L (ref 98–111)
Creatinine, Ser: 1.28 mg/dL — ABNORMAL HIGH (ref 0.61–1.24)
GFR, Estimated: >60 mL/min (ref >60)
Glucose, Bld: 104 mg/dL — ABNORMAL HIGH (ref 70–99)
Potassium: 4.3 mmol/L (ref 3.5–5.1)
Sodium: 139 mmol/L (ref 135–145)

## 2024-01-18 LAB — CBC
HCT: 40.5 % (ref 39.0–52.0)
Hemoglobin: 13.7 g/dL (ref 13.0–17.0)
MCH: 26.1 pg (ref 26.0–34.0)
MCHC: 33.8 g/dL (ref 30.0–36.0)
MCV: 77.3 fL — ABNORMAL LOW (ref 80.0–100.0)
Platelets: 163 10*3/uL (ref 150–400)
RBC: 5.24 MIL/uL (ref 4.22–5.81)
RDW: 13.4 % (ref 11.5–15.5)
WBC: 11 10*3/uL — ABNORMAL HIGH (ref 4.0–10.5)
nRBC: 0 % (ref 0.0–0.2)

## 2024-01-18 LAB — HIV ANTIBODY (ROUTINE TESTING W REFLEX): HIV Screen 4th Generation wRfx: NONREACTIVE

## 2024-01-18 SURGERY — INCISION AND DRAINAGE, ABSCESS
Anesthesia: General | Laterality: Left

## 2024-01-18 MED ORDER — MIDAZOLAM HCL 2 MG/2ML IJ SOLN: 0.5000 mg | Freq: Once | INTRAMUSCULAR | Status: DC | PRN

## 2024-01-18 MED ORDER — BUPIVACAINE HCL (PF) 0.25 % IJ SOLN
INTRAMUSCULAR | Status: AC
  Filled 2024-01-18: qty 30

## 2024-01-18 MED ORDER — LACTATED RINGERS IV SOLN: INTRAVENOUS | Status: DC | PRN

## 2024-01-18 MED ORDER — FENTANYL CITRATE (PF) 100 MCG/2ML IJ SOLN: 25.0000 ug | INTRAMUSCULAR | Status: DC | PRN

## 2024-01-18 MED ORDER — FENTANYL CITRATE (PF) 250 MCG/5ML IJ SOLN
INTRAMUSCULAR | Status: DC | PRN
  Administered 2024-01-18: 50 ug via INTRAVENOUS
  Administered 2024-01-18: 100 ug via INTRAVENOUS

## 2024-01-18 MED ORDER — PROPOFOL 10 MG/ML IV BOLUS
INTRAVENOUS | Status: AC
Start: 2024-01-18 — End: ?
  Filled 2024-01-18: qty 20

## 2024-01-18 MED ORDER — ORAL CARE MOUTH RINSE: 15.0000 mL | Freq: Once | OROMUCOSAL | Status: AC

## 2024-01-18 MED ORDER — MIDAZOLAM HCL 2 MG/2ML IJ SOLN
INTRAMUSCULAR | Status: AC
  Filled 2024-01-18: qty 2

## 2024-01-18 MED ORDER — BUPIVACAINE HCL (PF) 0.25 % IJ SOLN
INTRAMUSCULAR | Status: DC | PRN
  Administered 2024-01-18: 20 mL

## 2024-01-18 MED ORDER — SODIUM CHLORIDE 0.9 % IR SOLN
Status: DC | PRN
  Administered 2024-01-18: 3000 mL

## 2024-01-18 MED ORDER — PROPOFOL 10 MG/ML IV BOLUS
INTRAVENOUS | Status: DC | PRN
  Administered 2024-01-18: 200 mg via INTRAVENOUS

## 2024-01-18 MED ORDER — OXYCODONE HCL 5 MG/5ML PO SOLN: 5.0000 mg | Freq: Once | ORAL | Status: DC | PRN

## 2024-01-18 MED ORDER — FENTANYL CITRATE (PF) 250 MCG/5ML IJ SOLN
INTRAMUSCULAR | Status: AC
  Filled 2024-01-18: qty 5

## 2024-01-18 MED ORDER — MEPERIDINE HCL 25 MG/ML IJ SOLN: 6.2500 mg | INTRAMUSCULAR | Status: DC | PRN

## 2024-01-18 MED ORDER — OXYCODONE HCL 5 MG PO TABS: 5.0000 mg | ORAL_TABLET | Freq: Once | ORAL | Status: DC | PRN

## 2024-01-18 MED ORDER — LIDOCAINE HCL (CARDIAC) PF 100 MG/5ML IV SOSY
PREFILLED_SYRINGE | INTRAVENOUS | Status: DC | PRN
  Administered 2024-01-18: 20 mg via INTRATRACHEAL

## 2024-01-18 MED ORDER — LACTATED RINGERS IV SOLN: INTRAVENOUS | Status: DC

## 2024-01-18 MED ORDER — MIDAZOLAM HCL 5 MG/5ML IJ SOLN
INTRAMUSCULAR | Status: DC | PRN
  Administered 2024-01-18: 2 mg via INTRAVENOUS

## 2024-01-18 MED ORDER — PROPOFOL 10 MG/ML IV BOLUS
INTRAVENOUS | Status: AC
  Filled 2024-01-18: qty 20

## 2024-01-18 MED ORDER — ONDANSETRON HCL 4 MG/2ML IJ SOLN
INTRAMUSCULAR | Status: AC
  Filled 2024-01-18: qty 2

## 2024-01-18 MED ORDER — 0.9 % SODIUM CHLORIDE (POUR BTL) OPTIME
TOPICAL | Status: DC | PRN
  Administered 2024-01-18: 1000 mL

## 2024-01-18 MED ORDER — ONDANSETRON HCL 4 MG/2ML IJ SOLN
INTRAMUSCULAR | Status: DC | PRN
  Administered 2024-01-18: 4 mg via INTRAVENOUS

## 2024-01-18 MED ORDER — PHENYLEPHRINE 80 MCG/ML (10ML) SYRINGE FOR IV PUSH (FOR BLOOD PRESSURE SUPPORT)
PREFILLED_SYRINGE | INTRAVENOUS | Status: AC
  Filled 2024-01-18: qty 10

## 2024-01-18 MED ORDER — PHENYLEPHRINE HCL (PRESSORS) 10 MG/ML IV SOLN
INTRAVENOUS | Status: DC | PRN
  Administered 2024-01-18 (×2): 80 ug via INTRAVENOUS

## 2024-01-18 MED ORDER — LIDOCAINE 2% (20 MG/ML) 5 ML SYRINGE
INTRAMUSCULAR | Status: AC
  Filled 2024-01-18: qty 5

## 2024-01-18 MED ORDER — CHLORHEXIDINE GLUCONATE 4 % EX SOLN
Freq: Every day | CUTANEOUS | Status: DC
  Administered 2024-01-19 – 2024-01-20 (×2): 1 via TOPICAL
  Filled 2024-01-18: qty 15

## 2024-01-18 MED ORDER — CHLORHEXIDINE GLUCONATE 0.12 % MT SOLN
15.0000 mL | Freq: Once | OROMUCOSAL | Status: AC
  Administered 2024-01-18: 15 mL via OROMUCOSAL

## 2024-01-18 SURGICAL SUPPLY — 61 items
BAG COUNTER SPONGE SURGICOUNT (BAG) ×1 IMPLANT
BNDG COHESIVE 1X5 TAN STRL LF (GAUZE/BANDAGES/DRESSINGS) IMPLANT
BNDG ELASTIC 2X5.8 VLCR STR LF (GAUZE/BANDAGES/DRESSINGS) ×1 IMPLANT
BNDG ELASTIC 3INX 5YD STR LF (GAUZE/BANDAGES/DRESSINGS) ×1 IMPLANT
BNDG ELASTIC 4X5.8 VLCR STR LF (GAUZE/BANDAGES/DRESSINGS) ×1 IMPLANT
BNDG ESMARK 4X9 LF (GAUZE/BANDAGES/DRESSINGS) IMPLANT
BNDG GAUZE DERMACEA FLUFF 4 (GAUZE/BANDAGES/DRESSINGS) ×2 IMPLANT
CHLORAPREP W/TINT 26 (MISCELLANEOUS) ×1 IMPLANT
CORD BIPOLAR FORCEPS 12FT (ELECTRODE) ×1 IMPLANT
COVER BACK TABLE 60X90IN (DRAPES) IMPLANT
COVER MAYO STAND STRL (DRAPES) ×1 IMPLANT
COVER SURGICAL LIGHT HANDLE (MISCELLANEOUS) ×1 IMPLANT
CUFF TOURN SGL QUICK 18X4 (TOURNIQUET CUFF) ×1 IMPLANT
CUFF TRNQT CYL 24X4X16.5-23 (TOURNIQUET CUFF) IMPLANT
DRAIN PENROSE 0.25X18 (DRAIN) IMPLANT
DRAIN PENROSE 18X1/4 LTX STRL (DRAIN) IMPLANT
DRAPE HALF SHEET 40X57 (DRAPES) ×1 IMPLANT
DRAPE OEC MINIVIEW 54X84 (DRAPES) IMPLANT
DRAPE SURG 17X23 STRL (DRAPES) ×1 IMPLANT
DRSG ADAPTIC 3X8 NADH LF (GAUZE/BANDAGES/DRESSINGS) ×1 IMPLANT
DRSG XEROFORM 1X8 (GAUZE/BANDAGES/DRESSINGS) IMPLANT
GAUZE SPONGE 4X4 12PLY STRL (GAUZE/BANDAGES/DRESSINGS) ×1 IMPLANT
GAUZE STRETCH 2X75IN STRL (MISCELLANEOUS) IMPLANT
GAUZE XEROFORM 1X8 LF (GAUZE/BANDAGES/DRESSINGS) ×1 IMPLANT
GLOVE BIO SURGEON STRL SZ7 (GLOVE) ×1 IMPLANT
GLOVE BIOGEL PI IND STRL 7.0 (GLOVE) ×1 IMPLANT
GOWN STRL REUS W/ TWL XL LVL3 (GOWN DISPOSABLE) ×2 IMPLANT
IV CATH 18G X1.75 CATHLON (IV SOLUTION) IMPLANT
KIT BASIN OR (CUSTOM PROCEDURE TRAY) ×1 IMPLANT
KIT TURNOVER KIT B (KITS) ×1 IMPLANT
LOOP VASCLR MAXI BLUE 18IN ST (MISCELLANEOUS) IMPLANT
LOOPS VASCLR MAXI BLUE 18IN ST (MISCELLANEOUS) IMPLANT
MANIFOLD NEPTUNE II (INSTRUMENTS) IMPLANT
NDL HYPO 25GX1X1/2 BEV (NEEDLE) IMPLANT
NDL HYPO 25X1 1.5 SAFETY (NEEDLE) ×1 IMPLANT
NDL KEITH (NEEDLE) IMPLANT
NEEDLE HYPO 25GX1X1/2 BEV (NEEDLE) IMPLANT
NEEDLE HYPO 25X1 1.5 SAFETY (NEEDLE) ×1 IMPLANT
NEEDLE KEITH (NEEDLE) IMPLANT
NS IRRIG 1000ML POUR BTL (IV SOLUTION) ×1 IMPLANT
PACK ORTHO EXTREMITY (CUSTOM PROCEDURE TRAY) ×1 IMPLANT
PAD ABD 8X10 STRL (GAUZE/BANDAGES/DRESSINGS) ×1 IMPLANT
PAD ARMBOARD POSITIONER FOAM (MISCELLANEOUS) ×1 IMPLANT
PAD CAST 4YDX4 CTTN HI CHSV (CAST SUPPLIES) ×2 IMPLANT
PADDING CAST ABS COTTON 3X4 (CAST SUPPLIES) ×1 IMPLANT
SPLINT PLASTER CAST XFAST 3X15 (CAST SUPPLIES) ×1 IMPLANT
SPONGE T-LAP 4X18 ~~LOC~~+RFID (SPONGE) ×1 IMPLANT
SUT ETHILON 4 0 PS 2 18 (SUTURE) IMPLANT
SUTURE FIBERWR 3-0 18 TAPR NDL (SUTURE) IMPLANT
SWAB CULTURE ESWAB REG 1ML (MISCELLANEOUS) IMPLANT
SYR BULB EAR ULCER 3OZ GRN STR (SYRINGE) ×1 IMPLANT
SYR CONTROL 10ML LL (SYRINGE) IMPLANT
SYRINGE ORAL 60ML ENFIT (SYRINGE) IMPLANT
TOWEL GREEN STERILE (TOWEL DISPOSABLE) ×1 IMPLANT
TOWEL GREEN STERILE FF (TOWEL DISPOSABLE) ×2 IMPLANT
TUBE CONNECTING 12X1/4 (SUCTIONS) IMPLANT
TUBE NG 5FR 35IN ENFIT (TUBING) IMPLANT
TUBE PU 8FR 16IN ENFIT (TUBING) IMPLANT
UNDERPAD 30X36 HEAVY ABSORB (UNDERPADS AND DIAPERS) ×1 IMPLANT
WATER STERILE IRR 1000ML POUR (IV SOLUTION) ×1 IMPLANT
YANKAUER SUCT BULB TIP NO VENT (SUCTIONS) IMPLANT

## 2024-01-18 NOTE — Progress Notes (Signed)
 Pt rested for short intervals.medicated for c/o lt hand. Ice pack used at intervals. Has received ice packs twice tonight.

## 2024-01-18 NOTE — H&P (View-Only) (Signed)
 HAND SURGERY CONSULTATION  REQUESTING PHYSICIAN: [place], Eric J, DO   Chief Complaint: Left index finger pain  HPI: Robert Novak is a 68 y.o. male who presents with left index finger pain following a injection injury 7 days ago with a pressure washer.  He was seen by an outside orthopedic group in [facility] yesterday who advised him to present to the ER.  He was admitted overnight to the hospital medicine service.  He describes unchanged pain in the index finger mostly on the volar surface.  He denies any systemic symptoms.  He describes some numbness and paresthesias at the tip of the index finger.  He denies pain proximal to the palm.  Hand dominance: Right Occupation: Works in Surveyor, mining, mostly lifting activities  Past Medical History:  Diagnosis Date   Allergy    pollen   Hyperlipidemia    Hypertension    MRSA cellulitis    Vitamin D deficiency    Past Surgical History:  Procedure Laterality Date   UMBILICAL HERNIA REPAIR N/A 05/03/2021   Procedure: UMBILICAL HERNIA REPAIR;  Surgeon: Sim Dryer, MD;  Location: [facility] OR;  Service: General;  Laterality: N/A;   Social History   Socioeconomic History   Marital status: Single    Spouse name: Not on file   Number of children: Not on file   Years of education: Not on file   Highest education level: Not on file  Occupational History   Not on file  Tobacco Use   Smoking status: Never   Smokeless tobacco: Never  Vaping Use   Vaping status: Never Used  Substance and Sexual Activity   Alcohol use: No   Drug use: No   Sexual activity: Yes  Other Topics Concern   Not on file  Social History Narrative   Got Married in 2012.    Robert Novak already had 2 children.    Pt already had 2 children.    [name], they had an unexpected baby in 2012.       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   Social Drivers of Corporate investment banker Strain: Low Risk  (01/17/2024)   Received from [facility]    Overall Financial Resource Strain (CARDIA)    Difficulty of Paying Living Expenses: Not very hard  Food Insecurity: No Food Insecurity (01/17/2024)   Hunger Vital Sign    Worried About Running Out of Food in the Last Year: Never true    Ran Out of Food in the Last Year: Never true  Transportation Needs: No Transportation Needs (01/17/2024)   PRAPARE - Administrator, Civil Service (Medical): No    Lack of Transportation (Non-Medical): No  Physical Activity: Not on file  Stress: Not on file  Social Connections: Socially Integrated (01/17/2024)   Social Connection and Isolation Panel [NHANES]    Frequency of Communication with Friends and Family: Three times a week    Frequency of Social Gatherings with Friends and Family: Twice a week    Attends Religious Services: More than 4 times per year    Active Member of [employer] or Organizations: Yes    Attends Engineer, structural: More than 4 times per year    Marital Status: Married   History reviewed. No pertinent family history. - negative except otherwise stated in the family history section No Known Allergies Prior to Admission medications   Medication Sig Start Date End Date Taking? Authorizing Provider  acetaminophen  (TYLENOL ) 500 MG tablet  Take 2 tablets (1,000 mg total) by mouth every 6 (six) hours as needed. 05/04/21   Marlin Simmonds, PA-C  amLODipine  (NORVASC ) 10 MG tablet TAKE 1 TABLET BY MOUTH EVERY DAY 08/07/21   Austine Lefort, MD  atorvastatin  (LIPITOR) 10 MG tablet Take 1 tablet (10 mg total) by mouth at bedtime. 07/15/19   [place], Marvell Slider, MD  cetirizine  (ZYRTEC ) 10 MG tablet Take 1 tablet (10 mg total) by mouth daily. Patient taking differently: Take 10 mg by mouth daily as needed for allergies. 12/21/20   Wilhemena Harbour, NP  GARLIC PO Take 1 tablet by mouth daily.    [provider]  lisinopril  (ZESTRIL ) 5 MG tablet Take 5 mg by mouth daily. 12/12/23 12/11/24  [provider]  metoprolol   succinate (TOPROL -XL) 50 MG 24 hr tablet TAKE 1 TABLET BY MOUTH EVERY DAY Patient not taking: No sig reported 01/10/21   Austine Lefort, MD  oxyCODONE  (OXY IR/ROXICODONE ) 5 MG immediate release tablet Take 1 tablet (5 mg total) by mouth every 6 (six) hours as needed for moderate pain. 05/04/21   Marlin Simmonds, PA-C  rosuvastatin (CRESTOR) 5 MG tablet Take 5 mg by mouth daily. 01/15/24 01/14/25  [provider]  sildenafil  (VIAGRA ) 100 MG tablet Take 100 mg by mouth as needed for erectile dysfunction. 01/06/24 02/05/24  [provider]  tadalafil  (CIALIS ) 20 MG tablet Take 10-20mg  1 hour before intercourse Patient taking differently: Take 10-20 mg by mouth daily as needed for erectile dysfunction. 07/15/19   Mathis Som, MD   DG Finger Index Right Result Date: 01/17/2024 CLINICAL DATA:  the pt has a red swollen index finger since he was using a pressure washer last week and struck the finger with the pressure washer EXAM: RIGHT INDEX FINGER 2+V COMPARISON:  None Available. FINDINGS: There is no evidence of fracture or dislocation. No erosive change or bony destruction. Minimal degenerative spurring of the distal interphalangeal joint and metacarpal phalangeal joint. Generalized soft tissue edema and thickening. No radiopaque foreign body or soft tissue gas. IMPRESSION: 1. Generalized soft tissue edema and thickening. No acute osseous abnormality. 2. Minimal degenerative spurring of the distal interphalangeal joint and metacarpophalangeal joint. Electronically Signed   By: Chadwick Colonel M.D.   On: 01/17/2024 18:34   - Positive ROS: All other systems have been reviewed and were otherwise negative with the exception of those mentioned in the HPI and as above.  Physical Exam: General: No acute distress, resting comfortably Cardiovascular: BUE warm and well perfused, normal rate Respiratory: Normal WOB on RA Skin: Warm and dry Neurologic: Sensation intact distally Psychiatric:  Patient is at baseline mood and affect  Left upper Extremity  Diffuse swelling of the index finger from the level of the palmar digital crease to the fingertip.  There is a wound at the radial aspect of the DIP flexion crease.  There is no drainage this morning.  There is erythema of the index finger extending proximally to the level of the dorsal hand.  He has tenderness palpation along the volar aspect of the index finger.  He has limited active range of motion secondary to pain and stiffness.  He does have erythematous streaking up his forearm.  Sensation is diminished to light touch of the radial and ulnar border the index finger but intact throughout the remainder of his hand.  All fingers are warm and well-perfused with brisk cap refill.    Assessment: 68 year old male with left index finger infection following high-pressure  injection injury with water from pressure washer 7 days ago.  Plan: Patient has an infection of the left index finger with potential flexor tenosynovitis.  I reviewed the nature of this infection with the patient and his family at length.  I have recommended formal irrigation debridement of the finger for likely abscess as well as possible irrigation of the flexor tendon sheath.  We reviewed the nature of the surgery including the associated risks which include bleeding, persistent infection, damage to neurovascular structures, delayed wound healing, flexor tendon injury, pulley injury resulting in bowstringing, stiffness, need for additional surgery.    We will start daily wound care tomorrow with warm, diluted Hibiclens  solution and clean, dry dressings.    I will take intraoperative cultures to help guide antibiotic choice.    Marilyn Shropshire, M.D. [facility] 8:15 AM

## 2024-01-18 NOTE — Plan of Care (Signed)
 Pt alert and oriented x4. Sitting in the bed. Spouse is present and supportive. Pt's pastor visited this pm. Lt hand elevated on pillows. Drsg dry and intact to lt hand. C/o mild discomfort to the lt hand; medicated with tylenol  650mg  po. Pt is afebrile at present.Tolerating IV antibiotics. Encouraged to notify the staff for assistance with ADLs. Call light in reach. Sr x2 elevated. Bed in low position,

## 2024-01-18 NOTE — Progress Notes (Signed)
 PROGRESS NOTE    Robert Novak  QMV:[id number] DOB: 1956-04-10 DOA: 01/17/2024 PCP: Will Hare, NP    Brief Narrative:   Robert Novak is a 68 y.o. male with past medical history significant for HTN, HLD, CKD stage II who presented to [facility] ED on 01/17/2024 with left hand/index finger pain, swelling and purulent drainage.  Patient reports using a pressure washer on 01/11/2024 when he injured his left index finger with the spray.  Developed small wound palmar aspect of the left index finger with subsequent progressive swelling, erythema, pain and drainage.  Reports chills and subjective fever day prior to admission.  Erythema has spread to involve the radial aspect of the left hand.  He is unable to flex or extend the finger due to severe pain.  In the ED, temperature 98.7 F, HR 84, RR 16, BP 113/79, SpO2 97% on room air.  WBC 12.3, hemoglobin 15.3, platelet count 197.  Sodium 136, potassium 4.3, chloride 102, CO2 24, glucose 102, BUN 15, creat 1.40.  AST 20, ALT 19, total bilirubin 1.5.  Blood cultures x 2 obtained.  Orthopedic hand surgery was consulted.  Patient was given 500 mL LR, Tdap, vancomycin and morphine  by EDP.  TRH consulted for admission for further evaluation management of left index finger infection due to high-pressure injection injury from pressure washer.  Assessment & Plan:   Left index finger abscess with flexor tenosynovitis secondary to high-pressure injection injury Patient presenting to ED with progressive swelling, pain, erythema and purulent discharge from his left hand/index finger following injury from pressure washer use.  Patient received Tdap vaccination in the ED.  Orthopedic hand surgery was consulted and patient underwent I&D index finger abscess with irrigation of flexor tendon sheath, excisional debridement necrotic tissue fingertip involving skin, subcu tissue and tendon by Dr. Kerby Pearson on 01/18/2024 -- Orthopedics following, appreciate assistance -- WBC  12.3>11.0 -- Blood cultures x 2: no growth <12h -- Operative culture: Pending -- Vancomycin, pharmacy consulted for dosing/monitoring -- Ceftriaxone 2 g IV every 24 hours -- Supportive care, pain control -- wound care per orthopedics starting tomorrow with warm, diluted Hibiclens  solution, dry dressing  -- CBC in am -- Outpatient follow-up with orthopedics 1 week for wound check  HTN -- Amlodipine  10 mg p.o. daily  HLD -- Atorvastatin  10 mg p.o. daily  CKD stage II -- Cr 1.40>1.28; stable -- BMP in am  Obesity, class I Body mass index is 31.6 kg/m.   DVT prophylaxis: SCDs Start: 01/17/24 2005    Code Status: Full Code Family Communication: Updated family present at bedside this morning  Disposition Plan:  Level of care: Med-Surg Status is: Inpatient Remains inpatient appropriate because: IV antibiotics    Consultants:  Orthopedic hand surgery, Dr. Glenora Laos  Procedures:  I&D index finger abscess with irrigation of flexor tendon sheath, excisional debridement necrotic tissue fingertip involving skin, subcu tissue and tendon, Dr. Kerby Pearson 01/18/2024  Antimicrobials:  Vancomycin 5/9>> Ceftriaxone 5/9>>   Subjective: Patient seen examined bedside, just returned from PACU from operative management, I&D with excisional debridement by orthopedic hand surgery.  No specific complaints or concerns at this time.  Pain controlled.  Family present at bedside.  Denies headache, no chest pain, no shortness of breath, no abdominal pain, no fever, no chills, no nausea/vomiting/diarrhea.  No acute events overnight per nursing staff.  Objective: Vitals:   01/18/24 1015 01/18/24 1030 01/18/24 1045 01/18/24 1052  BP: 107/78 114/76 117/82   Pulse: 73 61 73 77  Resp: 12  19 14 17   Temp:    98.3 F (36.8 C)  TempSrc:      SpO2: 94% 90% 96% 94%  Weight:      Height:        Intake/Output Summary (Last 24 hours) at 01/18/2024 1105 Last data filed at 01/18/2024 1052 Gross per 24  hour  Intake 2144.51 ml  Output 10 ml  Net 2134.51 ml   Filed Weights   01/17/24 1638  Weight: 83.5 kg    Examination:  Physical Exam: GEN: NAD, alert and oriented x 3, obese HEENT: NCAT, PERRL, EOMI, sclera clear, MMM PULM: CTAB w/o wheezes/crackles, normal respiratory effort, on room air CV: RRR w/o M/G/R GI: abd soft, NTND, NABS, no R/G/M MSK: no peripheral edema, left hand with surgical dressing/Ace wrap in place, clean/dry/intact NEURO: No focal neurological deficit PSYCH: normal mood/affect Integumentary: Left hand as above, otherwise no other concerning rashes/lesions/wounds noted on exposed skin surfaces         Data Reviewed: I have personally reviewed following labs and imaging studies  CBC: Recent Labs  Lab 01/17/24 1650 01/18/24 0535  WBC 12.3* 11.0*  HGB 15.3 13.7  HCT 45.6 40.5  MCV 77.7* 77.3*  PLT 197 163   Basic Metabolic Panel: Recent Labs  Lab 01/17/24 1650 01/18/24 0535  NA 136 139  K 4.3 4.3  CL 102 106  CO2 24 21*  GLUCOSE 102* 104*  BUN 15 18  CREATININE 1.40* 1.28*  CALCIUM  8.9 8.3*   GFR: Estimated Creatinine Clearance: 55.3 mL/min (A) (by C-G formula based on SCr of 1.28 mg/dL (H)). Liver Function Tests: Recent Labs  Lab 01/17/24 1650  AST 20  ALT 19  ALKPHOS 65  BILITOT 1.5*  PROT 7.2  ALBUMIN 3.9   No results for input(s): "LIPASE", "AMYLASE" in the last 168 hours. No results for input(s): "AMMONIA" in the last 168 hours. Coagulation Profile: No results for input(s): "INR", "PROTIME" in the last 168 hours. Cardiac Enzymes: No results for input(s): "CKTOTAL", "CKMB", "CKMBINDEX", "TROPONINI" in the last 168 hours. BNP (last 3 results) No results for input(s): "PROBNP" in the last 8760 hours. HbA1C: No results for input(s): "HGBA1C" in the last 72 hours. CBG: No results for input(s): "GLUCAP" in the last 168 hours. Lipid Profile: No results for input(s): "CHOL", "HDL", "LDLCALC", "TRIG", "CHOLHDL",  "LDLDIRECT" in the last 72 hours. Thyroid  Function Tests: No results for input(s): "TSH", "T4TOTAL", "FREET4", "T3FREE", "THYROIDAB" in the last 72 hours. Anemia Panel: No results for input(s): "VITAMINB12", "FOLATE", "FERRITIN", "TIBC", "IRON", "RETICCTPCT" in the last 72 hours. Sepsis Labs: No results for input(s): "PROCALCITON", "LATICACIDVEN" in the last 168 hours.  Recent Results (from the past 240 hours)  Blood culture (routine x 2)     Status: None (Preliminary result)   Collection Time: 01/17/24  7:47 PM   Specimen: BLOOD RIGHT HAND  Result Value Ref Range Status   Specimen Description BLOOD RIGHT HAND  Final   Special Requests   Final    BOTTLES DRAWN AEROBIC AND ANAEROBIC Blood Culture adequate volume   Culture   Final    NO GROWTH < 12 HOURS Performed at [facility], [address], [place], [place] [address]    Report Status PENDING  Incomplete  Blood culture (routine x 2)     Status: None (Preliminary result)   Collection Time: 01/17/24  7:47 PM   Specimen: BLOOD RIGHT HAND  Result Value Ref Range Status   Specimen Description BLOOD RIGHT HAND  Final   Special Requests   Final    BOTTLES DRAWN AEROBIC AND ANAEROBIC Blood Culture adequate volume   Culture   Final    NO GROWTH < 12 HOURS Performed at [facility], [address], [place], [place] [address]    Report Status PENDING  Incomplete         Radiology Studies: DG Finger Index Right Result Date: 01/17/2024 CLINICAL DATA:  the pt has a red swollen index finger since he was using a pressure washer last week and struck the finger with the pressure washer EXAM: RIGHT INDEX FINGER 2+V COMPARISON:  None Available. FINDINGS: There is no evidence of fracture or dislocation. No erosive change or bony destruction. Minimal degenerative spurring of the distal interphalangeal joint and metacarpal phalangeal joint. Generalized soft tissue edema and thickening. No radiopaque foreign body or soft tissue gas.  IMPRESSION: 1. Generalized soft tissue edema and thickening. No acute osseous abnormality. 2. Minimal degenerative spurring of the distal interphalangeal joint and metacarpophalangeal joint. Electronically Signed   By: Chadwick Colonel M.D.   On: 01/17/2024 18:34        Scheduled Meds:  [MAR Hold] amLODipine   10 mg Oral Daily   [MAR Hold] atorvastatin   10 mg Oral QHS   [START ON 01/19/2024] chlorhexidine    Topical Daily   Continuous Infusions:  [MAR Hold] cefTRIAXone (ROCEPHIN)  IV 2 g (01/18/24 1021)   lactated ringers  10 mL/hr at 01/18/24 0747   [MAR Hold] vancomycin       LOS: 1 day    Time spent: 52 minutes spent on 01/18/2024 caring for this patient face-to-face including chart review, ordering labs/tests, documenting, discussion with nursing staff, consultants, updating family and interview/physical exam    Rema Care [place], DO [facility] Available via Epic secure chat 7am-7pm After these hours, please refer to coverage provider listed on [email or web address] 01/18/2024, 11:05 AM

## 2024-01-18 NOTE — Consult Note (Signed)
 HAND SURGERY CONSULTATION  REQUESTING PHYSICIAN: [place], Eric J, DO   Chief Complaint: Left index finger pain  HPI: Robert Novak is a 68 y.o. male who presents with left index finger pain following a injection injury 7 days ago with a pressure washer.  He was seen by an outside orthopedic group in [facility] yesterday who advised him to present to the ER.  He was admitted overnight to the hospital medicine service.  He describes unchanged pain in the index finger mostly on the volar surface.  He denies any systemic symptoms.  He describes some numbness and paresthesias at the tip of the index finger.  He denies pain proximal to the palm.  Hand dominance: Right Occupation: Works in Surveyor, mining, mostly lifting activities  Past Medical History:  Diagnosis Date   Allergy    pollen   Hyperlipidemia    Hypertension    MRSA cellulitis    Vitamin D deficiency    Past Surgical History:  Procedure Laterality Date   UMBILICAL HERNIA REPAIR N/A 05/03/2021   Procedure: UMBILICAL HERNIA REPAIR;  Surgeon: Sim Dryer, MD;  Location: [facility] OR;  Service: General;  Laterality: N/A;   Social History   Socioeconomic History   Marital status: Single    Spouse name: Not on file   Number of children: Not on file   Years of education: Not on file   Highest education level: Not on file  Occupational History   Not on file  Tobacco Use   Smoking status: Never   Smokeless tobacco: Never  Vaping Use   Vaping status: Never Used  Substance and Sexual Activity   Alcohol use: No   Drug use: No   Sexual activity: Yes  Other Topics Concern   Not on file  Social History Narrative   Got Married in 2012.    Robert Novak already had 2 children.    Pt already had 2 children.    [name], they had an unexpected baby in 2012.       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   Social Drivers of Corporate investment banker Strain: Low Risk  (01/17/2024)   Received from [facility]    Overall Financial Resource Strain (CARDIA)    Difficulty of Paying Living Expenses: Not very hard  Food Insecurity: No Food Insecurity (01/17/2024)   Hunger Vital Sign    Worried About Running Out of Food in the Last Year: Never true    Ran Out of Food in the Last Year: Never true  Transportation Needs: No Transportation Needs (01/17/2024)   PRAPARE - Administrator, Civil Service (Medical): No    Lack of Transportation (Non-Medical): No  Physical Activity: Not on file  Stress: Not on file  Social Connections: Socially Integrated (01/17/2024)   Social Connection and Isolation Panel [NHANES]    Frequency of Communication with Friends and Family: Three times a week    Frequency of Social Gatherings with Friends and Family: Twice a week    Attends Religious Services: More than 4 times per year    Active Member of [employer] or Organizations: Yes    Attends Engineer, structural: More than 4 times per year    Marital Status: Married   History reviewed. No pertinent family history. - negative except otherwise stated in the family history section No Known Allergies Prior to Admission medications   Medication Sig Start Date End Date Taking? Authorizing Provider  acetaminophen  (TYLENOL ) 500 MG tablet  Take 2 tablets (1,000 mg total) by mouth every 6 (six) hours as needed. 05/04/21   Marlin Simmonds, PA-C  amLODipine  (NORVASC ) 10 MG tablet TAKE 1 TABLET BY MOUTH EVERY DAY 08/07/21   Austine Lefort, MD  atorvastatin  (LIPITOR) 10 MG tablet Take 1 tablet (10 mg total) by mouth at bedtime. 07/15/19   [place], Marvell Slider, MD  cetirizine  (ZYRTEC ) 10 MG tablet Take 1 tablet (10 mg total) by mouth daily. Patient taking differently: Take 10 mg by mouth daily as needed for allergies. 12/21/20   Wilhemena Harbour, NP  GARLIC PO Take 1 tablet by mouth daily.    [provider]  lisinopril  (ZESTRIL ) 5 MG tablet Take 5 mg by mouth daily. 12/12/23 12/11/24  [provider]  metoprolol   succinate (TOPROL -XL) 50 MG 24 hr tablet TAKE 1 TABLET BY MOUTH EVERY DAY Patient not taking: No sig reported 01/10/21   Austine Lefort, MD  oxyCODONE  (OXY IR/ROXICODONE ) 5 MG immediate release tablet Take 1 tablet (5 mg total) by mouth every 6 (six) hours as needed for moderate pain. 05/04/21   Marlin Simmonds, PA-C  rosuvastatin (CRESTOR) 5 MG tablet Take 5 mg by mouth daily. 01/15/24 01/14/25  [provider]  sildenafil  (VIAGRA ) 100 MG tablet Take 100 mg by mouth as needed for erectile dysfunction. 01/06/24 02/05/24  [provider]  tadalafil  (CIALIS ) 20 MG tablet Take 10-20mg  1 hour before intercourse Patient taking differently: Take 10-20 mg by mouth daily as needed for erectile dysfunction. 07/15/19   Mathis Som, MD   DG Finger Index Right Result Date: 01/17/2024 CLINICAL DATA:  the pt has a red swollen index finger since he was using a pressure washer last week and struck the finger with the pressure washer EXAM: RIGHT INDEX FINGER 2+V COMPARISON:  None Available. FINDINGS: There is no evidence of fracture or dislocation. No erosive change or bony destruction. Minimal degenerative spurring of the distal interphalangeal joint and metacarpal phalangeal joint. Generalized soft tissue edema and thickening. No radiopaque foreign body or soft tissue gas. IMPRESSION: 1. Generalized soft tissue edema and thickening. No acute osseous abnormality. 2. Minimal degenerative spurring of the distal interphalangeal joint and metacarpophalangeal joint. Electronically Signed   By: Chadwick Colonel M.D.   On: 01/17/2024 18:34   - Positive ROS: All other systems have been reviewed and were otherwise negative with the exception of those mentioned in the HPI and as above.  Physical Exam: General: No acute distress, resting comfortably Cardiovascular: BUE warm and well perfused, normal rate Respiratory: Normal WOB on RA Skin: Warm and dry Neurologic: Sensation intact distally Psychiatric:  Patient is at baseline mood and affect  Left upper Extremity  Diffuse swelling of the index finger from the level of the palmar digital crease to the fingertip.  There is a wound at the radial aspect of the DIP flexion crease.  There is no drainage this morning.  There is erythema of the index finger extending proximally to the level of the dorsal hand.  He has tenderness palpation along the volar aspect of the index finger.  He has limited active range of motion secondary to pain and stiffness.  He does have erythematous streaking up his forearm.  Sensation is diminished to light touch of the radial and ulnar border the index finger but intact throughout the remainder of his hand.  All fingers are warm and well-perfused with brisk cap refill.    Assessment: 68 year old male with left index finger infection following high-pressure  injection injury with water from pressure washer 7 days ago.  Plan: Patient has an infection of the left index finger with potential flexor tenosynovitis.  I reviewed the nature of this infection with the patient and his family at length.  I have recommended formal irrigation debridement of the finger for likely abscess as well as possible irrigation of the flexor tendon sheath.  We reviewed the nature of the surgery including the associated risks which include bleeding, persistent infection, damage to neurovascular structures, delayed wound healing, flexor tendon injury, pulley injury resulting in bowstringing, stiffness, need for additional surgery.    We will start daily wound care tomorrow with warm, diluted Hibiclens  solution and clean, dry dressings.    I will take intraoperative cultures to help guide antibiotic choice.    Marilyn Shropshire, M.D. [facility] 8:15 AM

## 2024-01-18 NOTE — Op Note (Addendum)
 Date of Surgery: 01/18/2024  INDICATIONS: Patient is a 68 y.o.-year-old male with a high pressure injection injury with water to the left index finger that occurred seven days ago.  He presented to the ER last night with purulent drainage from the wound at the volar aspect of the finger tip and a diffusely swollen and erythematous finger concerning for abscess and/or flexor tenosynovitis.  Risks, benefits, and alternatives to surgery were discussed with the patient and his family in the preoperative area. The patient wishes to proceed with surgery.  Informed consent was signed after our discussion.   PREOPERATIVE DIAGNOSIS:  Left index finger infection   POSTOPERATIVE DIAGNOSIS:  High pressure injection injury to left index finger Left index finger abscess Left index finger flexor tenosynovitis  PROCEDURE:  Irrigation and drainage of index finger abscess, complex Irrigation of flexor tendon sheath Excisional debridement of necrotic tissue at finger tip involving skin, subcutaneous tissue, and tendon from distal portion of wound using knife and scissor; wound approx 3 cm in length    SURGEON: Auburn Blaze, M.D.  ASSIST: None  ANESTHESIA:  general  IV FLUIDS AND URINE: See anesthesia.  ESTIMATED BLOOD LOSS: 10 mL.  IMPLANTS: * No implants in log *   DRAINS: Penrose x 1  COMPLICATIONS: None noted  DESCRIPTION OF PROCEDURE: The patient was met in the preoperative holding area where the surgical site was marked and the consent form was signed.  The patient was then taken to the operating room and transferred to the operating table.  All bony prominences were well padded.  A tourniquet was applied to the left forearm.  General endotracheal anesthesia was induced.  The operative extremity was prepped and draped in the usual and sterile fashion.  A formal time-out was performed to confirm that this was the correct patient, surgery, side, and site.   Following formal timeout, the  limb was exsanguinated by gravity and the tourniquet inflated to 250 mmHg.  I began by making a Margeret Sheer style incision at the volar aspect of the index fingertip crossing the DIP flexion crease at the ulnar aspect in the area of the open wound.  Full-thickness skin flaps were elevated.  There was purulent material within the tip of the finger around the level of the A5 pulley and profundus insertion.  There was some necrotic adipose tissue in the area of the wound.  This was sharply excised using a tenotomy scissor.  In addition, there was some fraying of the profundus tendon in the area of the periarterial.  This frayed tendon was sharply debrided with a knife and tenotomy scissor.  There appeared to be gross purulence coming from both the subcutaneous tissue volar to the flexor tendon sheath as well as from within the tendon sheath system itself.  I then made a counterincision in the palm over the A1 pulley.  Blunt dissection was used to divide the subcutaneous tissue.  The A1 pulley was identified.  There was some apparent material that was expressible from the distal portion of the finger.  The A1 pulley was released.  During manipulation of the finger, there also appeared to be purulent fluid coming from the abscess volar to the flexor tendon system.  Blunt dissection with a tenotomy scissor was used to break up any potential loculations.  I then extended the distal incision in a proximal direction to approximately the level of the PIP flexion crease.  Full-thickness skin flaps were again elevated.  The radial and ulnar neurovascular bundles were  identified and protected.  Culture swabs of the purulent material were taken.  I then used a small pediatric feeding tube to thoroughly irrigate the flexor tendon sheath.  The irrigant was running clear following thorough irrigation.  I then irrigated the remainder of the finger using copious sterile saline via low flow cystoscopy tubing.  Once the finger appeared clean  without any evidence of infected material, the corners of the [place] incisions were reapproximated using a 4-0 nylon suture.  I passed 1/4 inch Penrose drain from the proximal in the palm into the incision around the level of the middle phalanx.  I then placed several more sparse 4-0 nylon sutures in the wound but left the majority of the wound open to drain.  Wound was dressed with Xeroform, 4 x 4 gauze, Kling wrap, and loose 1 inch Coban.  The tourniquet was deflated.  The fingertip was pink and perfused with intact capillary refill.   The patient was reversed from anesthesia and extubated uneventfully.  They were transferred from the operating table to the postoperative bed.  All counts were correct x 2 at the end of the procedure.  The patient was then taken to the PACU in stable condition.   POSTOPERATIVE PLAN: Patient will return to his room on the floor.  We will follow-up intraoperative cultures.  Patient will start wound care tomorrow with warm, diluted Hibiclens  solution and application of a clean, dry dressing.  I will see him back in the office in a week or so for a wound check at which time we will start therapy for wound care, edema control, and range of motion.  Auburn Blaze, MD 9:29 AM

## 2024-01-18 NOTE — Progress Notes (Signed)
 Pt received to room 5C-4. Transported via stretcher.alert and oriented x4. Family members with the pt.

## 2024-01-18 NOTE — Anesthesia Procedure Notes (Signed)
 Procedure Name: LMA Insertion Date/Time: 01/18/2024 8:39 AM  Performed by: Anita Kerry, CRNAPre-anesthesia Checklist: Patient identified, Emergency Drugs available, Suction available and Patient being monitored Patient Re-evaluated:Patient Re-evaluated prior to induction Oxygen Delivery Method: Circle system utilized Preoxygenation: Pre-oxygenation with 100% oxygen Induction Type: IV induction Ventilation: Mask ventilation without difficulty LMA: LMA inserted LMA Size: 4.0 Tube type: Oral Number of attempts: 1 Airway Equipment and Method: Oral airway Placement Confirmation: positive ETCO2 and breath sounds checked- equal and bilateral Tube secured with: Tape Dental Injury: Teeth and Oropharynx as per pre-operative assessment

## 2024-01-18 NOTE — Interval H&P Note (Signed)
 History and Physical Interval Note:  01/18/2024 8:20 AM  Robert Novak  has presented today for surgery, with the diagnosis of RIGHT INDEX FINGER ABSCESS.  The various methods of treatment have been discussed with the patient and family. After consideration of risks, benefits and other options for treatment, the patient has consented to  Procedure(s) with comments: INCISION AND DRAINAGE, ABSCESS (Left) - FINGER ABSCESS as a surgical intervention.  The patient's history has been reviewed, patient examined, no change in status, stable for surgery.  I have reviewed the patient's chart and labs.  Questions were answered to the patient's satisfaction.     Jacson Rapaport

## 2024-01-18 NOTE — Transfer of Care (Signed)
 Immediate Anesthesia Transfer of Care Note  Patient: Robert Novak  Procedure(s) Performed: INCISION AND DRAINAGE, ABSCESS (Left)  Patient Location: PACU  Anesthesia Type:General  Level of Consciousness: drowsy  Airway & Oxygen Therapy: Patient Spontanous Breathing  Post-op Assessment: Report given to RN and Post -op Vital signs reviewed and stable  Post vital signs: Reviewed and stable  Last Vitals:  Vitals Value Taken Time  BP 129/97 01/18/24 0947  Temp    Pulse 77 01/18/24 0951  Resp 13 01/18/24 0951  SpO2 94 % 01/18/24 0951  Vitals shown include unfiled device data.  Last Pain:  Vitals:   01/18/24 0740  TempSrc: Oral  PainSc: 7          Complications: No notable events documented.

## 2024-01-18 NOTE — Anesthesia Preprocedure Evaluation (Addendum)
 Anesthesia Evaluation  Patient identified by MRN, date of birth, ID band Patient awake    Reviewed: Allergy & Precautions, NPO status , Patient's Chart, lab work & pertinent test results  History of Anesthesia Complications Negative for: history of anesthetic complications  Airway Mallampati: II  TM Distance: >3 FB Neck ROM: Full    Dental  (+) Dental Advisory Given   Pulmonary neg pulmonary ROS   breath sounds clear to auscultation       Cardiovascular hypertension, Pt. on medications (-) CAD  Rhythm:Regular Rate:Normal  10/2022 ECHO:  INTERPRETATION  NORMAL LEFT VENTRICULAR SYSTOLIC FUNCTION  NORMAL RIGHT VENTRICULAR SYSTOLIC FUNCTION  MILD VALVULAR REGURGITATION (See above)  NO VALVULAR STENOSIS  ESTIMATED LVEF >55%     Neuro/Psych  negative psych ROS   GI/Hepatic negative GI ROS, Neg liver ROS,,,  Endo/Other  BMI 31.6  Renal/GU Renal InsufficiencyRenal disease     Musculoskeletal   Abdominal   Peds  Hematology negative hematology ROS (+)   Anesthesia Other Findings   Reproductive/Obstetrics                             Anesthesia Physical Anesthesia Plan  ASA: 2  Anesthesia Plan: General   Post-op Pain Management: Tylenol  PO (pre-op)* and Minimal or no pain anticipated   Induction: Intravenous  PONV Risk Score and Plan: 2 and Ondansetron  and Dexamethasone   Airway Management Planned: LMA  Additional Equipment: None  Intra-op Plan:   Post-operative Plan:   Informed Consent: I have reviewed the patients History and Physical, chart, labs and discussed the procedure including the risks, benefits and alternatives for the proposed anesthesia with the patient or authorized representative who has indicated his/her understanding and acceptance.     Dental advisory given  Plan Discussed with: CRNA and Surgeon  Anesthesia Plan Comments:         Anesthesia Quick  Evaluation

## 2024-01-18 NOTE — Plan of Care (Signed)
  Problem: Clinical Measurements: Goal: Will remain free from infection Outcome: Progressing   Problem: Activity: Goal: Risk for activity intolerance will decrease Outcome: Progressing   Problem: Pain Managment: Goal: General experience of comfort will improve and/or be controlled Outcome: Progressing

## 2024-01-18 NOTE — Anesthesia Postprocedure Evaluation (Signed)
 Anesthesia Post Note  Patient: Robert Novak  Procedure(s) Performed: INCISION AND DRAINAGE, ABSCESS (Left)     Patient location during evaluation: PACU Anesthesia Type: General Level of consciousness: awake and alert, patient cooperative and oriented Pain management: pain level controlled Vital Signs Assessment: post-procedure vital signs reviewed and stable Respiratory status: spontaneous breathing, nonlabored ventilation and respiratory function stable Cardiovascular status: blood pressure returned to baseline and stable Postop Assessment: no apparent nausea or vomiting Anesthetic complications: no   No notable events documented.  Last Vitals:  Vitals:   01/18/24 1045 01/18/24 1052  BP: 117/82   Pulse: 73 77  Resp: 14 17  Temp:  36.8 C  SpO2: 96% 94%    Last Pain:  Vitals:   01/18/24 1052  TempSrc:   PainSc: 0-No pain                 Axyl Sitzman,E. Dazja Houchin

## 2024-01-18 NOTE — Plan of Care (Signed)

## 2024-01-19 ENCOUNTER — Encounter (HOSPITAL_COMMUNITY): Payer: Self-pay | Admitting: Orthopedic Surgery

## 2024-01-19 DIAGNOSIS — L03012 Cellulitis of left finger: Secondary | ICD-10-CM | POA: Diagnosis not present

## 2024-01-19 LAB — CBC
HCT: 39 % (ref 39.0–52.0)
Hemoglobin: 13 g/dL (ref 13.0–17.0)
MCH: 26.3 pg (ref 26.0–34.0)
MCHC: 33.3 g/dL (ref 30.0–36.0)
MCV: 78.8 fL — ABNORMAL LOW (ref 80.0–100.0)
Platelets: 156 10*3/uL (ref 150–400)
RBC: 4.95 MIL/uL (ref 4.22–5.81)
RDW: 13.2 % (ref 11.5–15.5)
WBC: 10.4 10*3/uL (ref 4.0–10.5)
nRBC: 0 % (ref 0.0–0.2)

## 2024-01-19 LAB — BASIC METABOLIC PANEL WITH GFR
Anion gap: 9 (ref 5–15)
BUN: 12 mg/dL (ref 8–23)
CO2: 21 mmol/L — ABNORMAL LOW (ref 22–32)
Calcium: 8.1 mg/dL — ABNORMAL LOW (ref 8.9–10.3)
Chloride: 106 mmol/L (ref 98–111)
Creatinine, Ser: 1.17 mg/dL (ref 0.61–1.24)
GFR, Estimated: >60 mL/min (ref >60)
Glucose, Bld: 156 mg/dL — ABNORMAL HIGH (ref 70–99)
Potassium: 4.2 mmol/L (ref 3.5–5.1)
Sodium: 136 mmol/L (ref 135–145)

## 2024-01-19 MED ORDER — TRAMADOL HCL 50 MG PO TABS
50.0000 mg | ORAL_TABLET | Freq: Four times a day (QID) | ORAL | Status: DC | PRN
  Administered 2024-01-19 – 2024-01-20 (×4): 50 mg via ORAL
  Filled 2024-01-19 (×4): qty 1

## 2024-01-19 NOTE — Plan of Care (Signed)

## 2024-01-19 NOTE — Progress Notes (Signed)
 Pt at bedrest. He has experienced more lt hand pain tonight. He has received tylenol  and dilaudid  for the c/o pain,

## 2024-01-19 NOTE — Plan of Care (Signed)
 Pt alert and oriented x4. Skin warm and dry. Sitting in bed. Tolerating po fluids. Drsg intact to lt hand. Medicated prn for c/o pain to the lt hand. Lt hand elevated on pillows. Pt has ambulated to the bathroom independently.  Afebrile. Encouraged to call for assistance as needed. Call light in reach. Sr x2 elevated. Bed in low position.

## 2024-01-19 NOTE — Progress Notes (Signed)
 PROGRESS NOTE    Robert Novak  AOZ:[id number] DOB: 1955/12/20 DOA: 01/17/2024 PCP: Will Hare, NP    Brief Narrative:   Robert Novak is a 68 y.o. male with past medical history significant for HTN, HLD, CKD stage II who presented to [facility] ED on 01/17/2024 with left hand/index finger pain, swelling and purulent drainage.  Patient reports using a pressure washer on 01/11/2024 when he injured his left index finger with the spray.  Developed small wound palmar aspect of the left index finger with subsequent progressive swelling, erythema, pain and drainage.  Reports chills and subjective fever day prior to admission.  Erythema has spread to involve the radial aspect of the left hand.  He is unable to flex or extend the finger due to severe pain.  In the ED, temperature 98.7 F, HR 84, RR 16, BP 113/79, SpO2 97% on room air.  WBC 12.3, hemoglobin 15.3, platelet count 197.  Sodium 136, potassium 4.3, chloride 102, CO2 24, glucose 102, BUN 15, creat 1.40.  AST 20, ALT 19, total bilirubin 1.5.  Blood cultures x 2 obtained.  Orthopedic hand surgery was consulted.  Patient was given 500 mL LR, Tdap, vancomycin and morphine  by EDP.  TRH consulted for admission for further evaluation management of left index finger infection due to high-pressure injection injury from pressure washer.  Assessment & Plan:   Left index finger abscess with flexor tenosynovitis secondary to high-pressure injection injury Patient presenting to ED with progressive swelling, pain, erythema and purulent discharge from his left hand/index finger following injury from pressure washer use.  Patient received Tdap vaccination in the ED.  Orthopedic hand surgery was consulted and patient underwent I&D index finger abscess with irrigation of flexor tendon sheath, excisional debridement necrotic tissue fingertip involving skin, subcu tissue and tendon by Dr. Kerby Pearson on 01/18/2024 -- Orthopedics following, appreciate assistance -- WBC  12.3>11.0>10.4 -- Blood cultures x 2: no growth x 2 days -- Operative culture: Moderate Staphylococcus aureus, susceptibilities pending -- Vancomycin, pharmacy consulted for dosing/monitoring -- Ceftriaxone 2 g IV every 24 hours -- Supportive care, pain control -- wound care per orthopedics  -- Tylenol  650 mg PO q6h PRN mild pain -- Tramadol 50 mg p.o. q6h PRN moderate pain -- Outpatient follow-up with orthopedics 1 week for wound check  HTN -- Amlodipine  10 mg p.o. daily  HLD -- Atorvastatin  10 mg p.o. daily  CKD stage II -- Cr 1.40>1.28>1.17; stable  Obesity, class I Body mass index is 31.6 kg/m.   DVT prophylaxis: SCDs Start: 01/17/24 2005    Code Status: Full Code Family Communication: Updated family present at bedside this morning  Disposition Plan:  Level of care: Med-Surg Status is: Inpatient Remains inpatient appropriate because: IV antibiotics    Consultants:  Orthopedic hand surgery, Dr. Glenora Laos  Procedures:  I&D index finger abscess with irrigation of flexor tendon sheath, excisional debridement necrotic tissue fingertip involving skin, subcu tissue and tendon, Dr. Kerby Pearson 01/18/2024  Antimicrobials:  Vancomycin 5/9>> Ceftriaxone 5/9>>   Subjective: Patient seen examined bedside, sleeping but he is arousable.  Family present at bedside.  Slightly increased pain overnight but wants to avoid narcotics such as oxycodone , discussed tramadol and agreeable to try.  Asking when dressing is going to be replaced, instruct orthopedics likely plans to replace dressing later today.  No specific complaints or concerns at this time.  Pain controlled.  Family present at bedside.  Denies headache, no chest pain, no shortness of breath, no abdominal pain, no fever, no  chills, no nausea/vomiting/diarrhea.  No acute events overnight per nursing staff.  Objective: Vitals:   01/18/24 1607 01/18/24 2027 01/19/24 0524 01/19/24 0751  BP: 114/70 124/76 119/86 139/79  Pulse:  (!) 103 93 79 83  Resp: 16 17 18 16   Temp: 100.2 F (37.9 C) 98.5 F (36.9 C) 98.4 F (36.9 C) 98.7 F (37.1 C)  TempSrc: Oral Oral Oral Oral  SpO2: 99% 94% 96% 96%  Weight:      Height:        Intake/Output Summary (Last 24 hours) at 01/19/2024 0945 Last data filed at 01/18/2024 1649 Gross per 24 hour  Intake 207.78 ml  Output --  Net 207.78 ml   Filed Weights   01/17/24 1638  Weight: 83.5 kg    Examination:  Physical Exam: GEN: NAD, alert and oriented x 3, obese HEENT: NCAT, PERRL, EOMI, sclera clear, MMM PULM: CTAB w/o wheezes/crackles, normal respiratory effort, on room air CV: RRR w/o M/G/R GI: abd soft, NTND, NABS, no R/G/M MSK: no peripheral edema, left hand with surgical dressing/Ace wrap in place, clean/dry/intact NEURO: No focal neurological deficit PSYCH: normal mood/affect Integumentary: Left hand as above, otherwise no other concerning rashes/lesions/wounds noted on exposed skin surfaces   Data Reviewed: I have personally reviewed following labs and imaging studies  CBC: Recent Labs  Lab 01/17/24 1650 01/18/24 0535 01/19/24 0420  WBC 12.3* 11.0* 10.4  HGB 15.3 13.7 13.0  HCT 45.6 40.5 39.0  MCV 77.7* 77.3* 78.8*  PLT 197 163 156   Basic Metabolic Panel: Recent Labs  Lab 01/17/24 1650 01/18/24 0535 01/19/24 0420  NA 136 139 136  K 4.3 4.3 4.2  CL 102 106 106  CO2 24 21* 21*  GLUCOSE 102* 104* 156*  BUN 15 18 12   CREATININE 1.40* 1.28* 1.17  CALCIUM  8.9 8.3* 8.1*   GFR: Estimated Creatinine Clearance: 60.5 mL/min (by C-G formula based on SCr of 1.17 mg/dL). Liver Function Tests: Recent Labs  Lab 01/17/24 1650  AST 20  ALT 19  ALKPHOS 65  BILITOT 1.5*  PROT 7.2  ALBUMIN 3.9   No results for input(s): "LIPASE", "AMYLASE" in the last 168 hours. No results for input(s): "AMMONIA" in the last 168 hours. Coagulation Profile: No results for input(s): "INR", "PROTIME" in the last 168 hours. Cardiac Enzymes: No results for  input(s): "CKTOTAL", "CKMB", "CKMBINDEX", "TROPONINI" in the last 168 hours. BNP (last 3 results) No results for input(s): "PROBNP" in the last 8760 hours. HbA1C: No results for input(s): "HGBA1C" in the last 72 hours. CBG: No results for input(s): "GLUCAP" in the last 168 hours. Lipid Profile: No results for input(s): "CHOL", "HDL", "LDLCALC", "TRIG", "CHOLHDL", "LDLDIRECT" in the last 72 hours. Thyroid  Function Tests: No results for input(s): "TSH", "T4TOTAL", "FREET4", "T3FREE", "THYROIDAB" in the last 72 hours. Anemia Panel: No results for input(s): "VITAMINB12", "FOLATE", "FERRITIN", "TIBC", "IRON", "RETICCTPCT" in the last 72 hours. Sepsis Labs: No results for input(s): "PROCALCITON", "LATICACIDVEN" in the last 168 hours.  Recent Results (from the past 240 hours)  Blood culture (routine x 2)     Status: None (Preliminary result)   Collection Time: 01/17/24  7:47 PM   Specimen: BLOOD RIGHT HAND  Result Value Ref Range Status   Specimen Description BLOOD RIGHT HAND  Final   Special Requests   Final    BOTTLES DRAWN AEROBIC AND ANAEROBIC Blood Culture adequate volume   Culture   Final    NO GROWTH 2 DAYS Performed at [facility], [address] [place], [place] [address]    Report Status PENDING  Incomplete  Blood culture (routine x 2)     Status: None (Preliminary result)   Collection Time: 01/17/24  7:47 PM   Specimen: BLOOD RIGHT HAND  Result Value Ref Range Status   Specimen Description BLOOD RIGHT HAND  Final   Special Requests   Final    BOTTLES DRAWN AEROBIC AND ANAEROBIC Blood Culture adequate volume   Culture   Final    NO GROWTH 2 DAYS Performed at [facility], [address], [place], [place] [address]    Report Status PENDING  Incomplete  Aerobic/Anaerobic Culture w Gram Stain (surgical/deep wound)     Status: None (Preliminary result)   Collection Time: 01/18/24  9:02 AM   Specimen: PATH Cytology Misc. fluid; Body Fluid  Result Value Ref  Range Status   Specimen Description FLUID  Final   Special Requests left index finger swab  Final   Gram Stain NO WBC SEEN FEW GRAM POSITIVE COCCI   Final   Culture   Final    MODERATE STAPHYLOCOCCUS AUREUS SUSCEPTIBILITIES TO FOLLOW Performed at [facility] Lab, [address] [place], [place] [address]    Report Status PENDING  Incomplete         Radiology Studies: DG Finger Index Right Result Date: 01/17/2024 CLINICAL DATA:  the pt has a red swollen index finger since he was using a pressure washer last week and struck the finger with the pressure washer EXAM: RIGHT INDEX FINGER 2+V COMPARISON:  None Available. FINDINGS: There is no evidence of fracture or dislocation. No erosive change or bony destruction. Minimal degenerative spurring of the distal interphalangeal joint and metacarpal phalangeal joint. Generalized soft tissue edema and thickening. No radiopaque foreign body or soft tissue gas. IMPRESSION: 1. Generalized soft tissue edema and thickening. No acute osseous abnormality. 2. Minimal degenerative spurring of the distal interphalangeal joint and metacarpophalangeal joint. Electronically Signed   By: Chadwick Colonel M.D.   On: 01/17/2024 18:34        Scheduled Meds:  amLODipine   10 mg Oral Daily   atorvastatin   10 mg Oral QHS   chlorhexidine    Topical Daily   Continuous Infusions:  cefTRIAXone (ROCEPHIN)  IV 2 g (01/18/24 2101)   vancomycin 1,000 mg (01/18/24 2152)     LOS: 2 days    Time spent: 49 minutes spent on 01/19/2024 caring for this patient face-to-face including chart review, ordering labs/tests, documenting, discussion with nursing staff, consultants, updating family and interview/physical exam    Rema Care [place], DO [facility] Available via Epic secure chat 7am-7pm After these hours, please refer to coverage provider listed on [email or web address] 01/19/2024, 9:45 AM

## 2024-01-20 ENCOUNTER — Other Ambulatory Visit (HOSPITAL_COMMUNITY): Payer: Self-pay

## 2024-01-20 DIAGNOSIS — L03012 Cellulitis of left finger: Secondary | ICD-10-CM | POA: Diagnosis not present

## 2024-01-20 MED ORDER — LINEZOLID 600 MG PO TABS
600.0000 mg | ORAL_TABLET | Freq: Two times a day (BID) | ORAL | 0 refills | Status: AC
  Filled 2024-01-20: qty 14, 7d supply, fill #0

## 2024-01-20 MED ORDER — PNEUMOCOCCAL 20-VAL CONJ VACC 0.5 ML IM SUSY
0.5000 mL | PREFILLED_SYRINGE | Freq: Once | INTRAMUSCULAR | Status: AC
  Administered 2024-01-20: 0.5 mL via INTRAMUSCULAR
  Filled 2024-01-20: qty 0.5

## 2024-01-20 MED ORDER — CHLORHEXIDINE GLUCONATE 4 % EX SOLN
Freq: Every day | CUTANEOUS | 0 refills | Status: AC
  Filled 2024-01-20: qty 236, 30d supply, fill #0

## 2024-01-20 MED ORDER — PNEUMOCOCCAL 20-VAL CONJ VACC 0.5 ML IM SUSY: 0.5000 mL | PREFILLED_SYRINGE | INTRAMUSCULAR | Status: DC

## 2024-01-20 MED ORDER — TRAMADOL HCL 50 MG PO TABS
50.0000 mg | ORAL_TABLET | Freq: Four times a day (QID) | ORAL | 0 refills | Status: AC | PRN
  Filled 2024-01-20: qty 20, 5d supply, fill #0

## 2024-01-20 NOTE — Progress Notes (Signed)
 PROGRESS NOTE    Robert Novak  ZOX:[id number] DOB: [birth year] DOA: 01/17/2024 PCP: Robert Hare, NP    Brief Narrative:   Robert Novak is a 68 y.o. male with past medical history significant for HTN, HLD, CKD stage II who presented to [facility] ED on 01/17/2024 with left hand/index finger pain, swelling and purulent drainage.  Patient reports using a pressure washer on 01/11/2024 when he injured his left index finger with the spray.  Developed small wound palmar aspect of the left index finger with subsequent progressive swelling, erythema, pain and drainage.  Reports chills and subjective fever day prior to admission.  Erythema has spread to involve the radial aspect of the left hand.  He is unable to flex or extend the finger due to severe pain.  In the ED, temperature 98.7 F, HR 84, RR 16, BP 113/79, SpO2 97% on room air.  WBC 12.3, hemoglobin 15.3, platelet count 197.  Sodium 136, potassium 4.3, chloride 102, CO2 24, glucose 102, BUN 15, creat 1.40.  AST 20, ALT 19, total bilirubin 1.5.  Blood cultures x 2 obtained.  Orthopedic hand surgery was consulted.  Patient was given 500 mL LR, Tdap, vancomycin and morphine  by EDP.  TRH consulted for admission for further evaluation management of left index finger infection due to high-pressure injection injury from pressure washer.  Assessment & Plan:   Left index finger MRSA abscess with flexor tenosynovitis secondary to high-pressure injection injury Patient presenting to ED with progressive swelling, pain, erythema and purulent discharge from his left hand/index finger following injury from pressure washer use.  Patient received Tdap vaccination in the ED.  Orthopedic hand surgery was consulted and patient underwent I&D index finger abscess with irrigation of flexor tendon sheath, excisional debridement necrotic tissue fingertip involving skin, subcu tissue and tendon by Dr. Kerby Novak on 01/18/2024 -- Orthopedics following, appreciate assistance -- WBC  12.3>11.0>10.4> -- Blood cultures x 2: no growth x 3 days -- Operative culture: Moderate MRSA -- Vancomycin, pharmacy consulted for dosing/monitoring -- Supportive care, pain control -- wound care per orthopedics  -- Tylenol  650 mg PO q6h PRN mild pain -- Tramadol 50 mg p.o. q6h PRN moderate pain -- Outpatient follow-up with orthopedics 1 week for wound check  HTN -- Amlodipine  10 mg p.o. daily  HLD -- Atorvastatin  10 mg p.o. daily  CKD stage II -- Cr 1.40>1.28>1.17; stable  Obesity, class I Body mass index is 31.6 kg/m.   DVT prophylaxis: SCDs Start: 01/17/24 2005    Code Status: Full Code Family Communication: Updated family present at bedside this morning  Disposition Plan:  Level of care: Med-Surg Status is: Inpatient Remains inpatient appropriate because: IV antibiotics    Consultants:  Orthopedic hand surgery, Dr. Glenora Novak  Procedures:  I&D index finger abscess with irrigation of flexor tendon sheath, excisional debridement necrotic tissue fingertip involving skin, subcu tissue and tendon, Dr. Kerby Novak 01/18/2024  Antimicrobials:  Vancomycin 5/9>> Ceftriaxone 5/9 - 5/11   Subjective: Patient seen examined bedside, lying in bed.  No family present.  Pain controlled but continues with occasional "tingling sensation".  Operative culture with MRSA, discontinue ceftriaxone.  Remains on IV vancomycin.  No specific complaints or concerns at this time.  Pain controlled.  Family present at bedside.  Denies headache, no chest pain, no shortness of breath, no abdominal pain, no fever, no chills, no nausea/vomiting/diarrhea.  No acute events overnight per nursing staff.  Objective: Vitals:   01/19/24 1640 01/19/24 1935 01/20/24 0523 01/20/24 0728  BP: 139/82  139/86 136/89 130/89  Pulse: 90 100 88 90  Resp: 18 18 18 18   Temp: 98.3 F (36.8 C) 98.9 F (37.2 C) 98 F (36.7 C) 98.7 F (37.1 C)  TempSrc: Oral Oral Oral Oral  SpO2: 100% 97%  96%  Weight:       Height:        Intake/Output Summary (Last 24 hours) at 01/20/2024 0920 Last data filed at 01/20/2024 0300 Gross per 24 hour  Intake 845.17 ml  Output --  Net 845.17 ml   Filed Weights   01/17/24 1638  Weight: 83.5 kg    Examination:  Physical Exam: GEN: NAD, alert and oriented x 3, obese HEENT: NCAT, PERRL, EOMI, sclera clear, MMM PULM: CTAB w/o wheezes/crackles, normal respiratory effort, on room air CV: RRR w/o M/G/R GI: abd soft, NTND, NABS, no R/G/M MSK: no peripheral edema, left hand with surgical dressing/Ace wrap in place, clean/dry/intact NEURO: No focal neurological deficit PSYCH: normal mood/affect Integumentary: Left hand as above, otherwise no other concerning rashes/lesions/wounds noted on exposed skin surfaces   Data Reviewed: I have personally reviewed following labs and imaging studies  CBC: Recent Labs  Lab 01/17/24 1650 01/18/24 0535 01/19/24 0420  WBC 12.3* 11.0* 10.4  HGB 15.3 13.7 13.0  HCT 45.6 40.5 39.0  MCV 77.7* 77.3* 78.8*  PLT 197 163 156   Basic Metabolic Panel: Recent Labs  Lab 01/17/24 1650 01/18/24 0535 01/19/24 0420  NA 136 139 136  K 4.3 4.3 4.2  CL 102 106 106  CO2 24 21* 21*  GLUCOSE 102* 104* 156*  BUN 15 18 12   CREATININE 1.40* 1.28* 1.17  CALCIUM  8.9 8.3* 8.1*   GFR: Estimated Creatinine Clearance: 60.5 mL/min (by C-G formula based on SCr of 1.17 mg/dL). Liver Function Tests: Recent Labs  Lab 01/17/24 1650  AST 20  ALT 19  ALKPHOS 65  BILITOT 1.5*  PROT 7.2  ALBUMIN 3.9   No results for input(s): "LIPASE", "AMYLASE" in the last 168 hours. No results for input(s): "AMMONIA" in the last 168 hours. Coagulation Profile: No results for input(s): "INR", "PROTIME" in the last 168 hours. Cardiac Enzymes: No results for input(s): "CKTOTAL", "CKMB", "CKMBINDEX", "TROPONINI" in the last 168 hours. BNP (last 3 results) No results for input(s): "PROBNP" in the last 8760 hours. HbA1C: No results for input(s):  "HGBA1C" in the last 72 hours. CBG: No results for input(s): "GLUCAP" in the last 168 hours. Lipid Profile: No results for input(s): "CHOL", "HDL", "LDLCALC", "TRIG", "CHOLHDL", "LDLDIRECT" in the last 72 hours. Thyroid  Function Tests: No results for input(s): "TSH", "T4TOTAL", "FREET4", "T3FREE", "THYROIDAB" in the last 72 hours. Anemia Panel: No results for input(s): "VITAMINB12", "FOLATE", "FERRITIN", "TIBC", "IRON", "RETICCTPCT" in the last 72 hours. Sepsis Labs: No results for input(s): "PROCALCITON", "LATICACIDVEN" in the last 168 hours.  Recent Results (from the past 240 hours)  Blood culture (routine x 2)     Status: None (Preliminary result)   Collection Time: 01/17/24  7:47 PM   Specimen: BLOOD RIGHT HAND  Result Value Ref Range Status   Specimen Description BLOOD RIGHT HAND  Final   Special Requests   Final    BOTTLES DRAWN AEROBIC AND ANAEROBIC Blood Culture adequate volume   Culture   Final    NO GROWTH 3 DAYS Performed at [facility], [address] [place], [place] [address]    Report Status PENDING  Incomplete  Blood culture (routine x 2)     Status: None (Preliminary result)  Collection Time: 01/17/24  7:47 PM   Specimen: BLOOD RIGHT HAND  Result Value Ref Range Status   Specimen Description BLOOD RIGHT HAND  Final   Special Requests   Final    BOTTLES DRAWN AEROBIC AND ANAEROBIC Blood Culture adequate volume   Culture   Final    NO GROWTH 3 DAYS Performed at [facility], [address] [place], [place] [address]    Report Status PENDING  Incomplete  Aerobic/Anaerobic Culture w Gram Stain (surgical/deep wound)     Status: None (Preliminary result)   Collection Time: 01/18/24  9:02 AM   Specimen: PATH Cytology Misc. fluid; Body Fluid  Result Value Ref Range Status   Specimen Description FLUID  Final   Special Requests left index finger swab  Final   Gram Stain   Final    NO WBC SEEN FEW GRAM POSITIVE COCCI Performed at [facility]  Lab, [address] [place], [place] [address]    Culture   Final    MODERATE METHICILLIN RESISTANT STAPHYLOCOCCUS AUREUS   Report Status PENDING  Incomplete   Organism ID, Bacteria METHICILLIN RESISTANT STAPHYLOCOCCUS AUREUS  Final      Susceptibility   Methicillin resistant staphylococcus aureus - MIC*    CIPROFLOXACIN >=8 RESISTANT Resistant     ERYTHROMYCIN >=8 RESISTANT Resistant     GENTAMICIN <=0.5 SENSITIVE Sensitive     OXACILLIN >=4 RESISTANT Resistant     TETRACYCLINE <=1 SENSITIVE Sensitive     VANCOMYCIN 1 SENSITIVE Sensitive     TRIMETH/SULFA <=10 SENSITIVE Sensitive     CLINDAMYCIN  <=0.25 SENSITIVE Sensitive     RIFAMPIN <=0.5 SENSITIVE Sensitive     Inducible Clindamycin  NEGATIVE Sensitive     LINEZOLID 2 SENSITIVE Sensitive     * MODERATE METHICILLIN RESISTANT STAPHYLOCOCCUS AUREUS         Radiology Studies: No results found.       Scheduled Meds:  amLODipine   10 mg Oral Daily   atorvastatin   10 mg Oral QHS   chlorhexidine    Topical Daily   Continuous Infusions:  vancomycin Stopped (01/19/24 2136)     LOS: 3 days    Time spent: 49 minutes spent on 01/20/2024 caring for this patient face-to-face including chart review, ordering labs/tests, documenting, discussion with nursing staff, consultants, updating family and interview/physical exam    Rema Care [place], DO [facility] Available via Epic secure chat 7am-7pm After these hours, please refer to coverage provider listed on [email or web address] 01/20/2024, 9:20 AM

## 2024-01-20 NOTE — Progress Notes (Signed)
 [facility] has filled pt's scripts, were picked up and delivered to the pt at bedside to take home.  Pt will get the pneumonia vaccine before discharging today, vaccine has been ordered.   Pt states his wife gets off work at [employer], and will be here after 7pm. Pt states his wife will want to be here when his discharge instructions are given, pt's RN Ammon Bales made aware.   Robert Cowles,RN SWOT

## 2024-01-20 NOTE — TOC CM/SW Note (Signed)
 Transition of Care [facility]) - Inpatient Brief Assessment   Patient Details  Name: Robert Novak MRN: [id number] Date of Birth: March 21, 1956  Transition of Care [facility]) CM/SW Contact:    Juliane Och, LCSW Phone Number: 01/20/2024, 9:03 AM   Clinical Narrative:  9:03 AM Per chart review, patient resides at home with spouse. Patient has a PCP and insurance. Patient does not have HH/SNF/DME history. No TOC needs were identified at this time. TOC will continue to follow and be available to assist.  Transition of Care Asessment: Insurance and Status: Insurance coverage has been reviewed Patient has primary care physician: Yes Home environment has been reviewed: Private Residence Prior level of function:: N/A Prior/Current Home Services: No current home services Social Drivers of Health Review: SDOH reviewed no interventions necessary Readmission risk has been reviewed: Yes Transition of care needs: no transition of care needs at this time

## 2024-01-20 NOTE — TOC Transition Note (Signed)
 Transition of Care [facility]) - Discharge Note   Patient Details  Name: Robert Novak MRN: [id number] Date of Birth: 1956/06/20  Transition of Care [facility]) CM/SW Contact:  Jeani Mill, RN Phone Number: 01/20/2024, 3:19 PM   Clinical Narrative:    Patient stable for discharge.  Bedside RN taught dressing change. No other TOC needs at this time.    Final next level of care: Home/Self Care Barriers to Discharge: Barriers Resolved   Patient Goals and CMS Choice Patient states their goals for this hospitalization and ongoing recovery are:: return home          Discharge Placement             Home          Discharge Plan and Services Additional resources added to the After Visit Summary for                                       Social Drivers of Health (SDOH) Interventions SDOH Screenings   Food Insecurity: No Food Insecurity (01/17/2024)  Housing: Low Risk  (01/17/2024)  Transportation Needs: No Transportation Needs (01/17/2024)  Utilities: Not At Risk (01/17/2024)  Alcohol Screen: Low Risk  (03/25/2020)  Depression (PHQ2-9): Low Risk  (03/25/2020)  Financial Resource Strain: Low Risk  (01/17/2024)   Received from [facility]  Social Connections: Socially Integrated (01/17/2024)  Tobacco Use: Low Risk  (01/18/2024)     Readmission Risk Interventions    01/20/2024    3:19 PM  Readmission Risk Prevention Plan  Post Dischage Appt Complete  Medication Screening Complete  Transportation Screening Complete

## 2024-01-20 NOTE — Discharge Summary (Signed)
 Physician Discharge Summary  Robert Novak ZOX:[id number] DOB: [birth year] DOA: 01/17/2024  PCP: Will Hare, NP  Admit date: 01/17/2024 Discharge date: 01/20/2024  Admitted From:  Disposition:    Recommendations for Outpatient Follow-up:  Follow up with PCP in 1-2 weeks Please obtain BMP/CBC in one week Please follow up on the following pending results:  Home Health:  Equipment/Devices:   Discharge Condition:  CODE STATUS:  Diet recommendation:   History of present illness:  Robert Novak is a 68 y.o. male with past medical history significant for HTN, HLD, CKD stage II who presented to [facility] ED on 01/17/2024 with left hand/index finger pain, swelling and purulent drainage.  Patient reports using a pressure washer on 01/11/2024 when he injured his left index finger with the spray.  Developed small wound palmar aspect of the left index finger with subsequent progressive swelling, erythema, pain and drainage.  Reports chills and subjective fever day prior to admission.  Erythema has spread to involve the radial aspect of the left hand.  He is unable to flex or extend the finger due to severe pain.   In the ED, temperature 98.7 F, HR 84, RR 16, BP 113/79, SpO2 97% on room air.  WBC 12.3, hemoglobin 15.3, platelet count 197.  Sodium 136, potassium 4.3, chloride 102, CO2 24, glucose 102, BUN 15, creat 1.40.  AST 20, ALT 19, total bilirubin 1.5.  Blood cultures x 2 obtained.  Orthopedic hand surgery was consulted.  Patient was given 500 mL LR, Tdap, vancomycin and morphine  by EDP.  TRH consulted for admission for further evaluation management of left index finger infection due to high-pressure injection injury from pressure washer.  Hospital course:  Left index finger MRSA abscess with flexor tenosynovitis secondary to high-pressure injection injury Patient presenting to ED with progressive swelling, pain, erythema and purulent discharge from his left hand/index finger following injury from  pressure washer use.  Patient received Tdap vaccination in the ED.  Orthopedic hand surgery was consulted and patient underwent I&D index finger abscess with irrigation of flexor tendon sheath, excisional debridement necrotic tissue fingertip involving skin, subcu tissue and tendon by Dr. Kerby Pearson on 01/18/2024.  Operative culture with moderate MRSA.  Patient was treated with IV vancomycin will transition to linezolid 600 mg p.o. twice daily at time of discharge.  Continue Tylenol , NSAIDs, tramadol as needed for pain control on discharge.  Outpatient follow-up with orthopedics 1 week for wound check.   HTN Continue amlodipine  and lisinopril .   HLD Crestor 5 minutes.  CKD stage II Creatinine 1.17 at time of discharge, stable.   Obesity, class I Body mass index is 31.6 kg/m.  Discharge Diagnoses:  Principal Problem:   Cellulitis of left index finger Active Problems:   Hypertension   Hyperlipidemia   CKD (chronic kidney disease) stage 2, GFR 60-89 ml/min    Discharge Instructions  Discharge Instructions     Call MD for:  difficulty breathing, headache or visual disturbances   Complete by: As directed    Call MD for:  extreme fatigue   Complete by: As directed    Call MD for:  persistant dizziness or light-headedness   Complete by: As directed    Call MD for:  persistant nausea and vomiting   Complete by: As directed    Call MD for:  severe uncontrolled pain   Complete by: As directed    Call MD for:  temperature >100.4   Complete by: As directed    Diet - low sodium  heart healthy   Complete by: As directed    Discharge wound care:   Complete by: As directed    Soak hand in warm, diluted Hibiclens  solution for 10 minutes.  Fill basin with warm, bath temperature water.  Add two 15 mL packet to water or three heavy squirts from bottle (~ 2 tablespoons).  Soak hand for 10 minutes and  move fingers around in water.  Pat dry and apply a clean, dry dressing.   Discharge wound  care:   Complete by: As directed    Soak hand in warm, diluted Hibiclens  solution for 10 minutes.  Fill basin with warm, bath temperature water.  Add two 15 mL packet to water or three heavy squirts from bottle (~ 2 tablespoons).  Soak hand for 10 minutes and encourage patient to move fingers around in water.  Pat dry and apply a clean, dry dressing.   Increase activity slowly   Complete by: As directed    Increase activity slowly   Complete by: As directed       Allergies as of 01/20/2024   No Known Allergies      Medication List     TAKE these medications    acetaminophen  500 MG tablet Commonly known as: TYLENOL  Take 2 tablets (1,000 mg total) by mouth every 6 (six) hours as needed.   amLODipine  10 MG tablet Commonly known as: NORVASC  TAKE 1 TABLET BY MOUTH EVERY DAY What changed: when to take this   cetirizine  10 MG tablet Commonly known as: ZYRTEC  Take 1 tablet (10 mg total) by mouth daily. What changed:  when to take this reasons to take this   chlorhexidine  4 % external liquid Commonly known as: Hibiclens  Apply topically daily. Add two tablespoons to water basin daily and soak for 10 minutes as directed   GARLIC PO Take 1 capsule by mouth at bedtime.   linezolid 600 MG tablet Commonly known as: ZYVOX Take 1 tablet (600 mg total) by mouth 2 (two) times daily for 7 days.   lisinopril  5 MG tablet Commonly known as: ZESTRIL  Take 5 mg by mouth at bedtime.   metoprolol  succinate 50 MG 24 hr tablet Commonly known as: TOPROL -XL TAKE 1 TABLET BY MOUTH EVERY DAY What changed: when to take this   naproxen sodium 220 MG tablet Commonly known as: ALEVE Take 220 mg by mouth daily as needed (pain).   rosuvastatin 5 MG tablet Commonly known as: CRESTOR Take 5 mg by mouth at bedtime.   sildenafil  100 MG tablet Commonly known as: VIAGRA  Take 100 mg by mouth as needed for erectile dysfunction.   tadalafil  20 MG tablet Commonly known as: Cialis  Take 10-20mg  1  hour before intercourse What changed:  how much to take how to take this when to take this reasons to take this additional instructions   traMADol 50 MG tablet Commonly known as: ULTRAM Take 1 tablet (50 mg total) by mouth every 6 (six) hours as needed for moderate pain (pain score 4-6).               Discharge Care Instructions  (From admission, onward)           Start     Ordered   01/20/24 0000  Discharge wound care:       Comments: Soak hand in warm, diluted Hibiclens  solution for 10 minutes.  Fill basin with warm, bath temperature water.  Add two 15 mL packet to water or three heavy squirts from bottle (~  2 tablespoons).  Soak hand for 10 minutes and  move fingers around in water.  Pat dry and apply a clean, dry dressing.   01/20/24 1448   01/20/24 0000  Discharge wound care:       Comments: Soak hand in warm, diluted Hibiclens  solution for 10 minutes.  Fill basin with warm, bath temperature water.  Add two 15 mL packet to water or three heavy squirts from bottle (~ 2 tablespoons).  Soak hand for 10 minutes and encourage patient to move fingers around in water.  Pat dry and apply a clean, dry dressing.   01/20/24 1449            Follow-up Information     Fields, Daylene Evangelist, NP. Schedule an appointment as soon as possible for a visit in 1 week(s).   Specialty: Family Medicine Contact information: [address] [place] [address] [phone]         Marilyn Shropshire, MD. Schedule an appointment as soon as possible for a visit in 1 week(s).   Specialty: Orthopedic Surgery Contact information: [address] [place] [address] [phone]                No Known Allergies  Consultations: Orthopedics, Dr. Glenora Laos   Procedures/Studies: DG Finger Index Right Result Date: 01/17/2024 CLINICAL DATA:  the pt has a red swollen index finger since he was using a pressure washer last week and struck the finger with the  pressure washer EXAM: RIGHT INDEX FINGER 2+V COMPARISON:  None Available. FINDINGS: There is no evidence of fracture or dislocation. No erosive change or bony destruction. Minimal degenerative spurring of the distal interphalangeal joint and metacarpal phalangeal joint. Generalized soft tissue edema and thickening. No radiopaque foreign body or soft tissue gas. IMPRESSION: 1. Generalized soft tissue edema and thickening. No acute osseous abnormality. 2. Minimal degenerative spurring of the distal interphalangeal joint and metacarpophalangeal joint. Electronically Signed   By: Chadwick Colonel M.D.   On: 01/17/2024 18:34     Subjective: Patient seen examined bedside, lying in bed.  Pain controlled.  Discharging home.  Discussed needs close outpatient follow-up with orthopedics in 1 week.  Continue wound care/dressing changes.  No other specific complaints, concerns or questions at this time.  Denies headache, no dizziness, no chest pain, no palpitations, no shortness of breath, no abdominal pain, no fever/chills/night sweats, no nausea/vomiting/diarrhea, no focal weakness.  No acute concerns overnight per nursing staff.  Discharge Exam: Vitals:   01/20/24 0523 01/20/24 0728  BP: 136/89 130/89  Pulse: 88 90  Resp: 18 18  Temp: 98 F (36.7 C) 98.7 F (37.1 C)  SpO2:  96%   Vitals:   01/19/24 1640 01/19/24 1935 01/20/24 0523 01/20/24 0728  BP: 139/82 139/86 136/89 130/89  Pulse: 90 100 88 90  Resp: 18 18 18 18   Temp: 98.3 F (36.8 C) 98.9 F (37.2 C) 98 F (36.7 C) 98.7 F (37.1 C)  TempSrc: Oral Oral Oral Oral  SpO2: 100% 97%  96%  Weight:      Height:        Physical Exam: GEN: NAD, alert and oriented x 3, obese HEENT: NCAT, PERRL, EOMI, sclera clear, MMM PULM: CTAB w/o wheezes/crackles, normal respiratory effort, on room air CV: RRR w/o M/G/R GI: abd soft, NTND, NABS, no R/G/M MSK: no peripheral edema, left hand with surgical dressing/Ace wrap in place, clean/dry/intact NEURO:  No focal neurological deficit PSYCH: normal mood/affect Integumentary: Left hand as above,  otherwise no other concerning rashes/lesions/wounds noted on exposed skin surfaces    The results of significant diagnostics from this hospitalization (including imaging, microbiology, ancillary and laboratory) are listed below for reference.     Microbiology: Recent Results (from the past 240 hours)  Blood culture (routine x 2)     Status: None (Preliminary result)   Collection Time: 01/17/24  7:47 PM   Specimen: BLOOD RIGHT HAND  Result Value Ref Range Status   Specimen Description BLOOD RIGHT HAND  Final   Special Requests   Final    BOTTLES DRAWN AEROBIC AND ANAEROBIC Blood Culture adequate volume   Culture   Final    NO GROWTH 3 DAYS Performed at [facility], [address], [place], [place] [address]    Report Status PENDING  Incomplete  Blood culture (routine x 2)     Status: None (Preliminary result)   Collection Time: 01/17/24  7:47 PM   Specimen: BLOOD RIGHT HAND  Result Value Ref Range Status   Specimen Description BLOOD RIGHT HAND  Final   Special Requests   Final    BOTTLES DRAWN AEROBIC AND ANAEROBIC Blood Culture adequate volume   Culture   Final    NO GROWTH 3 DAYS Performed at [facility], [address], [place], [place] [address]    Report Status PENDING  Incomplete  Aerobic/Anaerobic Culture w Gram Stain (surgical/deep wound)     Status: None (Preliminary result)   Collection Time: 01/18/24  9:02 AM   Specimen: PATH Cytology Misc. fluid; Body Fluid  Result Value Ref Range Status   Specimen Description FLUID  Final   Special Requests left index finger swab  Final   Gram Stain   Final    NO WBC SEEN FEW GRAM POSITIVE COCCI Performed at [facility], [address] [place], [place] [address]    Culture   Final    MODERATE METHICILLIN RESISTANT STAPHYLOCOCCUS AUREUS NO ANAEROBES ISOLATED; CULTURE IN PROGRESS FOR 5 DAYS    Report Status  PENDING  Incomplete   Organism ID, Bacteria METHICILLIN RESISTANT STAPHYLOCOCCUS AUREUS  Final      Susceptibility   Methicillin resistant staphylococcus aureus - MIC*    CIPROFLOXACIN >=8 RESISTANT Resistant     ERYTHROMYCIN >=8 RESISTANT Resistant     GENTAMICIN <=0.5 SENSITIVE Sensitive     OXACILLIN >=4 RESISTANT Resistant     TETRACYCLINE <=1 SENSITIVE Sensitive     VANCOMYCIN 1 SENSITIVE Sensitive     TRIMETH/SULFA <=10 SENSITIVE Sensitive     CLINDAMYCIN  <=0.25 SENSITIVE Sensitive     RIFAMPIN <=0.5 SENSITIVE Sensitive     Inducible Clindamycin  NEGATIVE Sensitive     LINEZOLID 2 SENSITIVE Sensitive     * MODERATE METHICILLIN RESISTANT STAPHYLOCOCCUS AUREUS     Labs: BNP (last 3 results) No results for input(s): "BNP" in the last 8760 hours. Basic Metabolic Panel: Recent Labs  Lab 01/17/24 1650 01/18/24 0535 01/19/24 0420  NA 136 139 136  K 4.3 4.3 4.2  CL 102 106 106  CO2 24 21* 21*  GLUCOSE 102* 104* 156*  BUN 15 18 12   CREATININE 1.40* 1.28* 1.17  CALCIUM  8.9 8.3* 8.1*   Liver Function Tests: Recent Labs  Lab 01/17/24 1650  AST 20  ALT 19  ALKPHOS 65  BILITOT 1.5*  PROT 7.2  ALBUMIN 3.9   No results for input(s): "LIPASE", "AMYLASE" in the last 168 hours. No results for input(s): "AMMONIA" in the last 168  hours. CBC: Recent Labs  Lab 01/17/24 1650 01/18/24 0535 01/19/24 0420  WBC 12.3* 11.0* 10.4  HGB 15.3 13.7 13.0  HCT 45.6 40.5 39.0  MCV 77.7* 77.3* 78.8*  PLT 197 163 156   Cardiac Enzymes: No results for input(s): "CKTOTAL", "CKMB", "CKMBINDEX", "TROPONINI" in the last 168 hours. BNP: Invalid input(s): "POCBNP" CBG: No results for input(s): "GLUCAP" in the last 168 hours. D-Dimer No results for input(s): "DDIMER" in the last 72 hours. Hgb A1c No results for input(s): "HGBA1C" in the last 72 hours. Lipid Profile No results for input(s): "CHOL", "HDL", "LDLCALC", "TRIG", "CHOLHDL", "LDLDIRECT" in the last 72 hours. Thyroid  function  studies No results for input(s): "TSH", "T4TOTAL", "T3FREE", "THYROIDAB" in the last 72 hours.  Invalid input(s): "FREET3" Anemia work up No results for input(s): "VITAMINB12", "FOLATE", "FERRITIN", "TIBC", "IRON", "RETICCTPCT" in the last 72 hours. Urinalysis No results found for: "COLORURINE", "APPEARANCEUR", "LABSPEC", "PHURINE", "GLUCOSEU", "HGBUR", "BILIRUBINUR", "KETONESUR", "PROTEINUR", "UROBILINOGEN", "NITRITE", "LEUKOCYTESUR" Sepsis Labs Recent Labs  Lab 01/17/24 1650 01/18/24 0535 01/19/24 0420  WBC 12.3* 11.0* 10.4   Microbiology Recent Results (from the past 240 hours)  Blood culture (routine x 2)     Status: None (Preliminary result)   Collection Time: 01/17/24  7:47 PM   Specimen: BLOOD RIGHT HAND  Result Value Ref Range Status   Specimen Description BLOOD RIGHT HAND  Final   Special Requests   Final    BOTTLES DRAWN AEROBIC AND ANAEROBIC Blood Culture adequate volume   Culture   Final    NO GROWTH 3 DAYS Performed at [facility], [address] [place], [place] [address]    Report Status PENDING  Incomplete  Blood culture (routine x 2)     Status: None (Preliminary result)   Collection Time: 01/17/24  7:47 PM   Specimen: BLOOD RIGHT HAND  Result Value Ref Range Status   Specimen Description BLOOD RIGHT HAND  Final   Special Requests   Final    BOTTLES DRAWN AEROBIC AND ANAEROBIC Blood Culture adequate volume   Culture   Final    NO GROWTH 3 DAYS Performed at [facility], [address], [place], [place] [address]    Report Status PENDING  Incomplete  Aerobic/Anaerobic Culture w Gram Stain (surgical/deep wound)     Status: None (Preliminary result)   Collection Time: 01/18/24  9:02 AM   Specimen: PATH Cytology Misc. fluid; Body Fluid  Result Value Ref Range Status   Specimen Description FLUID  Final   Special Requests left index finger swab  Final   Gram Stain   Final    NO WBC SEEN FEW GRAM POSITIVE COCCI Performed at [facility], [address], [place], [place] [address]    Culture   Final    MODERATE METHICILLIN RESISTANT STAPHYLOCOCCUS AUREUS NO ANAEROBES ISOLATED; CULTURE IN PROGRESS FOR 5 DAYS    Report Status PENDING  Incomplete   Organism ID, Bacteria METHICILLIN RESISTANT STAPHYLOCOCCUS AUREUS  Final      Susceptibility   Methicillin resistant staphylococcus aureus - MIC*    CIPROFLOXACIN >=8 RESISTANT Resistant     ERYTHROMYCIN >=8 RESISTANT Resistant     GENTAMICIN <=0.5 SENSITIVE Sensitive     OXACILLIN >=4 RESISTANT Resistant     TETRACYCLINE <=1 SENSITIVE Sensitive     VANCOMYCIN 1 SENSITIVE Sensitive     TRIMETH/SULFA <=10 SENSITIVE Sensitive     CLINDAMYCIN  <=0.25 SENSITIVE Sensitive     RIFAMPIN <=0.5 SENSITIVE Sensitive  Inducible Clindamycin  NEGATIVE Sensitive     LINEZOLID 2 SENSITIVE Sensitive     * MODERATE METHICILLIN RESISTANT STAPHYLOCOCCUS AUREUS     Time coordinating discharge: Over 30 minutes  SIGNED:   Rema Care [place], DO  [facility] 01/20/2024, 2:49 PM

## 2024-01-20 NOTE — Progress Notes (Signed)
 Pt rested in the recliner for a few hours tonight. Afebrile. Stated that he can feel a slight tingling in his fingers (lt hand). Medicated this am with Tramadol po. Pt lying in bed with the lt hand elevated on pillows. He is anticipating discharge today. Call light in reach. Sr x 2 elevated. Bed in low position.

## 2024-01-22 LAB — CULTURE, BLOOD (ROUTINE X 2)
Culture: NO GROWTH
Culture: NO GROWTH
Special Requests: ADEQUATE
Special Requests: ADEQUATE

## 2024-01-23 LAB — AEROBIC/ANAEROBIC CULTURE W GRAM STAIN (SURGICAL/DEEP WOUND): Gram Stain: NONE SEEN
